# Patient Record
Sex: Female | Born: 2000 | Hispanic: No | Marital: Married | Smoking: Never smoker
Health system: Southern US, Community
[De-identification: ages and names within clinical notes are randomized; demographics above are authoritative.]

## PROBLEM LIST (undated history)

## (undated) DIAGNOSIS — D649 Anemia, unspecified: Secondary | ICD-10-CM

## (undated) HISTORY — DX: Anemia, unspecified: D64.9

## (undated) HISTORY — PX: TONSILLECTOMY: SUR1361

---
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
## 2020-01-27 LAB — RPR: RPR Ser Ql: NONREACTIVE — AB

## 2020-02-05 ENCOUNTER — Ambulatory Visit (INDEPENDENT_AMBULATORY_CARE_PROVIDER_SITE_OTHER): Payer: Medicaid Other | Admitting: Advanced Practice Midwife

## 2020-02-05 ENCOUNTER — Encounter: Payer: Self-pay | Admitting: Advanced Practice Midwife

## 2020-02-05 VITALS — BP 116/66 | HR 87

## 2020-02-05 DIAGNOSIS — N898 Other specified noninflammatory disorders of vagina: Secondary | ICD-10-CM

## 2020-02-05 DIAGNOSIS — N941 Unspecified dyspareunia: Secondary | ICD-10-CM | POA: Insufficient documentation

## 2020-02-05 NOTE — Progress Notes (Signed)
   GYN VISIT Patient name: Lindsay Bryant MRN [id number]  Date of birth: Dec 01, 2000 Chief Complaint:   IUD Check  History of Present Illness:   Lindsay Bryant is a 19 y.o. G69P1001  female being seen today for continued dyspareunia, which she notes began around April/May, possibly after strings were trimmed but she can't recall. Initial IUD placement was postpartum in Feb 2021.   May 2021: IUD check with strings trimmed; doing well but strings bother her partner June 2021: visit for dyspareunia (neg GC/chlam) July 2021: pelvic u/s>IUD centrally located, 3cm hemorrhagic R cyst, no pain during u/s  Depression screen [facility] 2/9 04/03/2019  Decreased Interest 0  Down, Depressed, Hopeless 0  PHQ - 2 Score 0  Altered sleeping 3  Tired, decreased energy 3  Change in appetite 0  Feeling bad or failure about yourself  0  Trouble concentrating 0  Moving slowly or fidgety/restless 0  Suicidal thoughts 0  PHQ-9 Score 6    No LMP recorded. (Menstrual status: IUD). The current method of family planning is IUD.  Last pap <21yo.  Review of Systems:   Pertinent items are noted in HPI Denies fever/chills, dizziness, headaches, visual disturbances, fatigue, shortness of breath, chest pain, abdominal pain, vomiting, abnormal vaginal discharge/itching/odor/irritation, problems with periods, bowel movements, urination, or intercourse unless otherwise stated above.  Pertinent History Reviewed:  Reviewed past medical,surgical, social, obstetrical and family history.  Reviewed problem list, medications and allergies. Physical Assessment:   Vitals:   02/05/20 1410  BP: 116/66  Pulse: 87  There is no height or weight on file to calculate BMI.       Physical Examination:   General appearance: alert, well appearing, and in no distress  Mental status: alert, oriented to person, place, and time  Skin: warm & dry   Cardiovascular: normal heart rate noted  Respiratory: normal respiratory effort, no  distress  Abdomen: soft, non-tender   Pelvic: normal external genitalia, vulva, vagina, cervix, uterus and adnexa; strings visualized; mod white vag d/c  Extremities: no edema   Chaperone: [place]    No results found for this or any previous visit (from the past 24 hour(s)).  Assessment & Plan:  1) Dyspareunia> offered IUD removal vs rx for Oril[place] 200mg  bid; she wants to think it over and will call with decision  2) STD screening today> will check once more  Meds: No orders of the defined types were placed in this encounter.   No orders of the defined types were placed in this encounter.   No follow-ups on file.  CNM 02/05/2020 3:47 PM

## 2020-02-08 LAB — NUSWAB VAGINITIS PLUS (VG+)
Candida albicans, NAA: NEGATIVE
Candida glabrata, NAA: NEGATIVE
Chlamydia trachomatis, NAA: NEGATIVE
Neisseria gonorrhoeae, NAA: NEGATIVE
Trich vag by NAA: NEGATIVE

## 2020-02-14 ENCOUNTER — Ambulatory Visit (INDEPENDENT_AMBULATORY_CARE_PROVIDER_SITE_OTHER): Payer: Medicaid Other | Admitting: Adult Health

## 2020-02-14 ENCOUNTER — Encounter: Payer: Self-pay | Admitting: Adult Health

## 2020-02-14 VITALS — BP 129/92 | HR 83 | Ht 67.0 in | Wt 149.0 lb

## 2020-02-14 DIAGNOSIS — Z3202 Encounter for pregnancy test, result negative: Secondary | ICD-10-CM | POA: Insufficient documentation

## 2020-02-14 DIAGNOSIS — Z30016 Encounter for initial prescription of transdermal patch hormonal contraceptive device: Secondary | ICD-10-CM | POA: Insufficient documentation

## 2020-02-14 DIAGNOSIS — N941 Unspecified dyspareunia: Secondary | ICD-10-CM

## 2020-02-14 DIAGNOSIS — Z30432 Encounter for removal of intrauterine contraceptive device: Secondary | ICD-10-CM | POA: Insufficient documentation

## 2020-02-14 LAB — POCT URINE PREGNANCY: Preg Test, Ur: NEGATIVE

## 2020-02-14 MED ORDER — NORELGESTROMIN-ETH ESTRADIOL 150-35 MCG/24HR TD PTWK: 1.0000 | MEDICATED_PATCH | TRANSDERMAL | 12 refills | Status: DC

## 2020-02-14 NOTE — Patient Instructions (Signed)
Ethinyl Estradiol; Norelgestromin skin patches What is this medicine? ETHINYL ESTRADIOL;NORELGESTROMIN (ETH in il es tra DYE ole; nor el JES troe min) skin patch is used as a contraceptive (birth control method). This medicine combines two types of female hormones, an estrogen and a progestin. This patch is used to prevent ovulation and pregnancy. This medicine may be used for other purposes; ask your health care provider or pharmacist if you have questions. COMMON BRAND NAME(S): Ortho Evra, Xulane What should I tell my health care provider before I take this medicine? They need to know if you have or ever had any of these conditions:  abnormal vaginal bleeding  blood vessel disease or blood clots  breast, cervical, endometrial, ovarian, liver, or uterine cancer  diabetes  gallbladder disease  having surgery  heart disease or recent heart attack  high blood pressure  high cholesterol or triglycerides  history of irregular heartbeat or heart valve problems  kidney disease  liver disease  migraine headaches  protein C deficiency  protein S deficiency  recently had a baby, miscarriage, or abortion  stroke  systemic lupus erythematosus (SLE)  tobacco smoker  an unusual or allergic reaction to estrogens, progestins, other medicines, foods, dyes, or preservatives  pregnant or trying to get pregnant  breast-feeding How should I use this medicine? This patch is applied to the skin. Follow the directions on the prescription label. Apply to clean, dry, healthy skin on the buttock, abdomen, upper outer arm or upper torso, in a place where it will not be rubbed by tight clothing. Do not use lotions or other cosmetics on the site where the patch will go. Press the patch firmly in place for 10 seconds to ensure good contact with the skin. Change the patch every 7 days on the same day of the week for 3 weeks. You will then have a break from the patch for 1 week, after which you  will apply a new patch. Do not use your medicine more often than directed. Contact your pediatrician regarding the use of this medicine in children. Special care may be needed. This medicine has been used in female children who have started having menstrual periods. A patient package insert for the product will be given with each prescription and refill. Read this sheet carefully each time. The sheet may change frequently. Overdosage: If you think you have taken too much of this medicine contact a poison control center or emergency room at once. NOTE: This medicine is only for you. Do not share this medicine with others. What if I miss a dose? You will need to replace your patch once a week as directed. If your patch is lost or falls off, contact your health care professional for advice. You may need to use another form of birth control if your patch has been off for more than 1 day. What may interact with this medicine? Do not take this medicine with the following medications:  dasabuvir; ombitasvir; paritaprevir; ritonavir  ombitasvir; paritaprevir; ritonavir This medicine may also interact with the following medications:  acetaminophen  antibiotics or medicines for infections, especially rifampin, rifabutin, rifapentine, and possibly penicillins or tetracyclines  aprepitant or fosaprepitant  armodafinil  ascorbic acid (vitamin C)  barbiturate medicines, such as phenobarbital or primidone  bosentan  certain antiviral medicines for hepatitis, HIV or AIDS  certain medicines for cancer treatment  certain medicines for seizures like carbamazepine, clobazam, felbamate, lamotrigine, oxcarbazepine, phenytoin, rufinamide, topiramate  certain medicines for treating high cholesterol  cyclosporine    dantrolene  elagolix  flibanserin  grapefruit juice  lesinurad  medicines for diabetes  medicines to treat fungal infections, such as griseofulvin, miconazole, fluconazole,  ketoconazole, itraconazole, posaconazole or voriconazole  mifepristone  mitotane  modafinil  morphine  mycophenolate  St. John's wort  tamoxifen  temazepam  theophylline or aminophylline  thyroid hormones  tizanidine  tranexamic acid  ulipristal  warfarin This list may not describe all possible interactions. Give your health care provider a list of all the medicines, herbs, non-prescription drugs, or dietary supplements you use. Also tell them if you smoke, drink alcohol, or use illegal drugs. Some items may interact with your medicine. What should I watch for while using this medicine? Visit your doctor or health care professional for regular checks on your progress. You will need a regular breast and pelvic exam and Pap smear while on this medicine. Use an additional method of contraception during the first cycle that you use this patch. If you have any reason to think you are pregnant, stop using this medicine right away and contact your doctor or health care professional. If you are using this medicine for hormone related problems, it may take several cycles of use to see improvement in your condition. Smoking increases the risk of getting a blood clot or having a stroke while you are using hormonal birth control, especially if you are more than 19 years old. You are strongly advised not to smoke. This medicine can make your body retain fluid, making your fingers, hands, or ankles swell. Your blood pressure can go up. Contact your doctor or health care professional if you feel you are retaining fluid. This medicine can make you more sensitive to the sun. Keep out of the sun. If you cannot avoid being in the sun, wear protective clothing and use sunscreen. Do not use sun lamps or tanning beds/booths. If you wear contact lenses and notice visual changes, or if the lenses begin to feel uncomfortable, consult your eye care specialist. In some women, tenderness, swelling, or  minor bleeding of the gums may occur. Notify your dentist if this happens. Brushing and flossing your teeth regularly may help limit this. See your dentist regularly and inform your dentist of the medicines you are taking. If you are going to have elective surgery or a MRI, you may need to stop using this medicine before the surgery or MRI. Consult your health care professional for advice. This medicine does not protect you against HIV infection (AIDS) or any other sexually transmitted diseases. What side effects may I notice from receiving this medicine? Side effects that you should report to your doctor or health care professional as soon as possible:  allergic reactions such as skin rash or itching, hives, swelling of the lips, mouth, tongue, or throat  breast tissue changes or discharge  dark patches of skin on your forehead, cheeks, upper lip, and chin  depression  high blood pressure  migraines or severe, sudden headaches  missed menstrual periods  signs and symptoms of a blood clot such as breathing problems; changes in vision; chest pain; severe, sudden headache; pain, swelling, warmth in the leg; trouble speaking; sudden numbness or weakness of the face, arm or leg  skin reactions at the patch site such as blistering, bleeding, itching, rash, or swelling  stomach pain  yellowing of the eyes or skin Side effects that usually do not require medical attention (report these to your doctor or health care professional if they continue or are bothersome):    breast tenderness  irregular vaginal bleeding or spotting, particularly during the first 3 months of use  headache  nausea  painful menstrual periods  skin redness or mild irritation at site where applied  weight gain (slight) This list may not describe all possible side effects. Call your doctor for medical advice about side effects. You may report side effects to FDA at 1-800-FDA-1088. Where should I keep my  medicine? Keep out of the reach of children. Store at room temperature between 15 and 30 degrees C (59 and 86 degrees F). Keep the patch in its pouch until time of use. Throw away any unused medicine after the expiration date. Dispose of used patches properly. Since a used patch may still contain active hormones, fold the patch in half so that it sticks to itself prior to disposal. Throw away in a place where children or pets cannot reach. NOTE: This sheet is a summary. It may not cover all possible information. If you have questions about this medicine, talk to your doctor, pharmacist, or health care provider.  2020 Elsevier/Gold Standard (2018-08-28 11:56:29)  

## 2020-02-14 NOTE — Progress Notes (Signed)
  Subjective:     Patient ID: Valleri Hendricksen, female   DOB: 07-05-2000, 19 y.o.   MRN: [id number]  HPI Dao is a 19 year old biracial female,married, G1P1 in requesting IUD to be removed, has pain with sex,and she wants to try the patch.  Review of Systems For IUD removal Has pain with sex Reviewed past medical,surgical, social and family history. Reviewed medications and allergies.     Objective:   Physical Exam BP (!) 129/92 (BP Location: Left Arm, Patient Position: Sitting, Cuff Size: Normal)   Pulse 83   Ht 5\' 7"  (1.702 m)   Wt 149 lb (67.6 kg)   LMP 01/20/2020 (Exact Date)   Breastfeeding No   BMI 23.34 kg/m   UPT is negative,  Consent signed for IUD removal.  Skin warm and dry.Pelvic: external genitalia is normal in appearance no lesions, vagina: pink and moist,urethra has no lesions or masses noted, cervix:bulbous,everted at os, IUD strings seen and grasped with forceps, pt ask to cough and IUD easily removed, uterus: normal size, shape and contour, non tender, no masses felt, adnexa: no masses or tenderness noted. Bladder is non tender and no masses felt. Examination chaperoned by 01/22/2020 LPN   [facility] - 02/14/20 1210      Pregnancy Intention Screening   Does the patient want to become pregnant in the next year? Unsure    Does the patient's partner want to become pregnant in the next year? Yes    Would the patient like to discuss contraceptive options today? Yes      Contraception Wrap Up   Current Method IUD or IUS    End Method Contraceptive Patch    Contraception Counseling Provided Yes             Assessment:     1. Urine pregnancy test negative  2. Encounter for IUD removal  3. Encounter for initial prescription of transdermal patch hormonal contraceptive device Discussed patch her her and gave handout for her review  Will Rx ortho evra patch Can place today and use condoms Meds ordered this encounter  Medications  . norelgestromin-ethinyl  estradiol (ORTHO EVRA) 150-35 MCG/24HR transdermal patch    Sig: Place 1 patch onto the skin once a week.    Dispense:  3 patch    Refill:  12    Order Specific Question:   Supervising Provider    Answer:   04/15/20, LUTHER H [[id number]]    4. Dyspareunia in female If pain continues let Despina Hidden know    Plan:     Follow up in 3 months for ROS on patch , or sooner if needed

## 2020-04-02 ENCOUNTER — Encounter: Payer: Self-pay | Admitting: *Deleted

## 2020-04-02 ENCOUNTER — Other Ambulatory Visit (INDEPENDENT_AMBULATORY_CARE_PROVIDER_SITE_OTHER): Payer: Medicaid Other | Admitting: *Deleted

## 2020-04-02 ENCOUNTER — Other Ambulatory Visit: Payer: Self-pay

## 2020-04-02 VITALS — Wt 152.0 lb

## 2020-04-02 DIAGNOSIS — R350 Frequency of micturition: Secondary | ICD-10-CM | POA: Diagnosis not present

## 2020-04-02 DIAGNOSIS — R3915 Urgency of urination: Secondary | ICD-10-CM | POA: Diagnosis not present

## 2020-04-02 DIAGNOSIS — R300 Dysuria: Secondary | ICD-10-CM

## 2020-04-02 LAB — POCT URINALYSIS DIPSTICK
Glucose, UA: NEGATIVE
Ketones, UA: NEGATIVE
Leukocytes, UA: NEGATIVE
Nitrite, UA: NEGATIVE
Protein, UA: POSITIVE — AB

## 2020-04-02 MED ORDER — SULFAMETHOXAZOLE-TRIMETHOPRIM 800-160 MG PO TABS: 1.0000 | ORAL_TABLET | Freq: Two times a day (BID) | ORAL | 0 refills | Status: DC

## 2020-04-02 NOTE — Progress Notes (Addendum)
   NURSE VISIT- UTI SYMPTOMS   SUBJECTIVE:  Lindsay Bryant is a 19 y.o. G60P1001 female here for UTI symptoms. She is a GYN patient. She reports dysuria, urinary frequency and urinary urgency.  OBJECTIVE:  Wt 152 lb (68.9 kg)   LMP 03/26/2020   BMI 23.81 kg/m   Appears well, in no apparent distress  Results for orders placed or performed in visit on 04/02/20 (from the past 24 hour(s))  POCT Urinalysis Dipstick   Collection Time: 04/02/20  2:56 PM  Result Value Ref Range   Color, UA     Clarity, UA     Glucose, UA Negative Negative   Bilirubin, UA     Ketones, UA neg    Spec Grav, UA     Blood, UA large    pH, UA     Protein, UA Positive (A) Negative   Urobilinogen, UA     Nitrite, UA neg    Leukocytes, UA Negative Negative   Appearance     Odor      ASSESSMENT: GYN patient with UTI symptoms and negative nitrites  PLAN: Discussed with Cathie Beams, CNM   Rx sent by provider today: Yes Urine culture sent Call or return to clinic prn if these symptoms worsen or fail to improve as anticipated. Follow-up: as needed   Jobe Marker  04/02/2020 2:57 PM   rx septra ds.  Chart reviewed for nurse visit. Agree with plan of care.  Jacklyn Shell, [place] 04/02/2020 7:18 PM

## 2020-04-02 NOTE — Addendum Note (Signed)
Addended by: Jacklyn Shell on: 04/02/2020 07:18 PM   Modules accepted: Orders

## 2020-04-05 LAB — URINE CULTURE

## 2020-04-06 LAB — URINE CULTURE

## 2020-05-15 ENCOUNTER — Ambulatory Visit: Payer: Medicaid Other | Admitting: Adult Health

## 2020-05-28 ENCOUNTER — Ambulatory Visit: Payer: Self-pay

## 2020-06-04 ENCOUNTER — Ambulatory Visit
Admission: EM | Admit: 2020-06-04 | Discharge: 2020-06-04 | Disposition: A | Discharge status: Home / Self Care | Payer: Medicaid Other | Attending: Family Medicine | Admitting: Family Medicine

## 2020-06-04 ENCOUNTER — Ambulatory Visit: Admit: 2020-06-04 | Discharge status: Absent without leave | Payer: Medicaid Other

## 2020-06-04 ENCOUNTER — Other Ambulatory Visit: Payer: Self-pay

## 2020-06-04 DIAGNOSIS — B349 Viral infection, unspecified: Secondary | ICD-10-CM | POA: Insufficient documentation

## 2020-06-04 DIAGNOSIS — J029 Acute pharyngitis, unspecified: Secondary | ICD-10-CM | POA: Diagnosis not present

## 2020-06-04 DIAGNOSIS — J069 Acute upper respiratory infection, unspecified: Secondary | ICD-10-CM | POA: Diagnosis not present

## 2020-06-04 DIAGNOSIS — J020 Streptococcal pharyngitis: Secondary | ICD-10-CM | POA: Diagnosis not present

## 2020-06-04 DIAGNOSIS — R5383 Other fatigue: Secondary | ICD-10-CM | POA: Diagnosis not present

## 2020-06-04 LAB — POCT RAPID STREP A (OFFICE): Rapid Strep A Screen: NEGATIVE

## 2020-06-04 MED ORDER — AZITHROMYCIN 250 MG PO TABS: 250.0000 mg | ORAL_TABLET | Freq: Every day | ORAL | 0 refills | Status: DC

## 2020-06-04 NOTE — Discharge Instructions (Addendum)
Rapid strep is negative Will culture and inform of positive results that require further treatment  I have sent in azithromycin for you to take. Take 2 tablets today, then one tablet daily for the next 4 days.  Fill this if flu and covid are negative  Your COVID and Flu tests are pending.  You should self quarantine until the test results are back.    Take Tylenol or ibuprofen as needed for fever or discomfort.  Rest and keep yourself hydrated.    Follow-up with your primary care provider if your symptoms are not improving.

## 2020-06-04 NOTE — ED Triage Notes (Signed)
Pt states that she has a sore throat and headache. x5 days. Pt states that she is not vaccinated. Negative at home rapid test x2 days ago.

## 2020-06-04 NOTE — ED Provider Notes (Signed)
[facility]   [id number] 06/04/20 Arrival Time: 1234  LO:VFIE THROAT  SUBJECTIVE: History from: patient.  Lindsay Bryant is a 19 y.o. female who presents with abrupt onset of sore throat and headache for 5 days. Denies sick exposure to Covid, strep, flu or mono, or precipitating event. Has tried tylenol without relief. Has negative history of Covid. Has not completed Covid vaccines. Reports negative home Covid test x 2 days ago. Symptoms are made worse with swallowing, but tolerating liquids and own secretions without difficulty.  Denies previous symptoms in the past.     Denies fever, chills, fatigue, ear pain, sinus pain, rhinorrhea, nasal congestion, cough, SOB, wheezing, chest pain, nausea, rash, changes in bowel or bladder habits.     ROS: As per HPI.  All other pertinent ROS negative.     Past Medical History:  Diagnosis Date  . Anemia    Past Surgical History:  Procedure Laterality Date  . TONSILLECTOMY     No Known Allergies No current facility-administered medications on file prior to encounter.   Current Outpatient Medications on File Prior to Encounter  Medication Sig Dispense Refill  . norelgestromin-ethinyl estradiol (ORTHO EVRA) 150-35 MCG/24HR transdermal patch Place 1 patch onto the skin once a week. 3 patch 12  . sulfamethoxazole-trimethoprim (BACTRIM DS) 800-160 MG tablet Take 1 tablet by mouth 2 (two) times daily. 10 tablet 0   Social History   Socioeconomic History  . Marital status: Married    Spouse name: Not on file  . Number of children: Not on file  . Years of education: Not on file  . Highest education level: High school graduate  Occupational History  . Not on file  Tobacco Use  . Smoking status: Never Smoker  . Smokeless tobacco: Never Used  Vaping Use  . Vaping Use: Never used  Substance and Sexual Activity  . Alcohol use: Never  . Drug use: Never  . Sexual activity: Yes    Birth control/protection: Patch  Other Topics  Concern  . Not on file  Social History Narrative  . Not on file   Social Determinants of Health   Financial Resource Strain: Not on file  Food Insecurity: Not on file  Transportation Needs: Not on file  Physical Activity: Not on file  Stress: Not on file  Social Connections: Not on file  Intimate Partner Violence: Not on file   Family History  Adopted: Yes    OBJECTIVE:  Vitals:   06/04/20 1332 06/04/20 1333  BP:  123/80  Pulse:  85  Temp:  98.6 F (37 C)  TempSrc:  Oral  SpO2:  96%  Weight: 144 lb (65.3 kg)   Height: 5\' 7"  (1.702 m)      General appearance: alert; appears fatigued, but nontoxic, speaking in full sentences and managing own secretions HEENT: NCAT; Ears: EACs clear, TMs pearly gray with visible cone of light, without erythema; Eyes: PERRL, EOMI grossly; Nose: no obvious rhinorrhea; Throat: oropharynx erythematous, cobblestoning present, tonsils 1+ and mildly erythematous without white tonsillar exudates, uvula midline Neck: supple with LAD Lungs: CTA bilaterally without adventitious breath sounds; cough absent Heart: regular rate and rhythm.  Radial pulses 2+ symmetrical bilaterally Skin: warm and dry Psychological: alert and cooperative; normal mood and affect  LABS: No results found for this or any previous visit (from the past 24 hour(s)).   ASSESSMENT & PLAN:  1. Upper respiratory tract infection, unspecified type   2. Sore throat   3. Viral illness  4. Other fatigue     Meds ordered this encounter  Medications  . azithromycin (ZITHROMAX) 250 MG tablet    Sig: Take 1 tablet (250 mg total) by mouth daily. Take first 2 tablets together, then 1 every day until finished.    Dispense:  6 tablet    Refill:  0    Fill 06/06/2020    Order Specific Question:   Supervising Provider    Answer:   Merrilee Jansky [[id number]]   Prescribed azithromycin for URI if viral testing is negative and symptoms have not resolved by 06/06/20 Covid test  pending Will inform of abnormal results Quarantine until test results are back and negative Strep test negative, will send out for culture and we will call you with results Declines test for mono at this time Get plenty of rest and push fluids Take OTC Zyrtec and use chloraseptic spray as needed for throat pain. Drink warm or cool liquids, use throat lozenges, or popsicles to help alleviate symptoms Take OTC ibuprofen or tylenol as needed for pain Follow up with PCP if symptoms persists Return or go to ER if patient has any new or worsening symptoms such as fever, chills, nausea, vomiting, worsening sore throat, cough, abdominal pain, chest pain, changes in bowel or bladder habits  Reviewed expectations re: course of current medical issues. Questions answered. Outlined signs and symptoms indicating need for more acute intervention. Patient verbalized understanding. After Visit Summary given.          Moshe Cipro, NP 06/07/20 1054

## 2020-06-06 LAB — SARS-COV-2, NAA 2 DAY TAT

## 2020-06-06 LAB — NOVEL CORONAVIRUS, NAA: SARS-CoV-2, NAA: NOT DETECTED

## 2020-06-07 LAB — CULTURE, GROUP A STREP (THRC)

## 2020-07-13 DIAGNOSIS — M25531 Pain in right wrist: Secondary | ICD-10-CM | POA: Diagnosis not present

## 2020-07-13 DIAGNOSIS — M25541 Pain in joints of right hand: Secondary | ICD-10-CM | POA: Diagnosis not present

## 2020-07-13 DIAGNOSIS — M79641 Pain in right hand: Secondary | ICD-10-CM | POA: Diagnosis not present

## 2020-07-26 IMAGING — MR MR HEAD WO/W CM
15 of 17 series · 44 of 48 positions shown · IV contrast (gadavist)
Comparison: None.

CLINICAL DATA: 18-year-old female post partum x4 weeks with severe
headache, worst headache of life.

EXAM:
MRI HEAD WITHOUT AND WITH CONTRAST
MRV HEAD WITHOUT CONTRAST
TECHNIQUE: Multiplanar, multiecho pulse sequences of the brain and surrounding
structures were obtained without and with intravenous contrast.
Angiographic images of the head were obtained using MRV technique
without and with contrast.
CONTRAST:  6mL GADAVIST GADOBUTROL 1 MMOL/ML IV SOLN

[Series 5: DWI · axial · 3.0mm · 0.88mm/px · z∈[-76,+46]mm · 2 of 42 slices shown (1 of 4)]
[im 1/42]
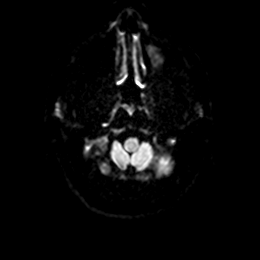
[im 42/42]
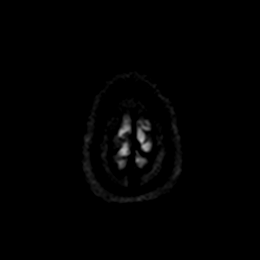

[Series 6: DWI · axial · 3.0mm · 0.88mm/px · z∈[-76,+46]mm · 2 of 42 slices shown (2 of 4)]
[im 1/42]
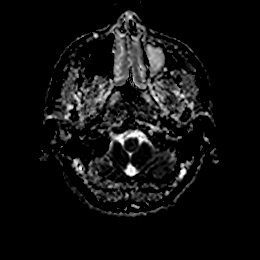
[im 42/42]
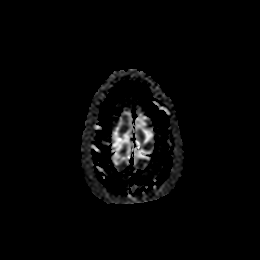

[Series 7: DWI · coronal · 4.0mm · 0.88mm/px · 1 of 32 slices shown (3 of 4)]
[im 1/32]
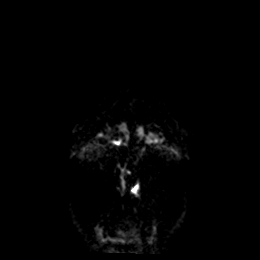

[Series 8: DWI · coronal · 4.0mm · 0.88mm/px · 1 of 32 slices shown (4 of 4)]
[im 1/32]
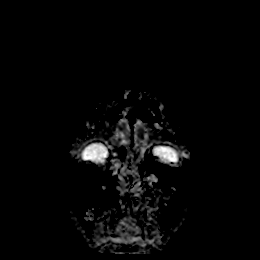

[Series 9: T1 · sagittal · 5.0mm · 0.75mm/px · 1 of 23 slices shown]
[im 1/23]
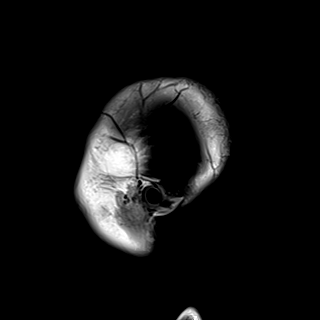

[Series 10: T2 · axial · 5.0mm · 0.72mm/px · 1 of 25 slices shown]
[im 1/25]
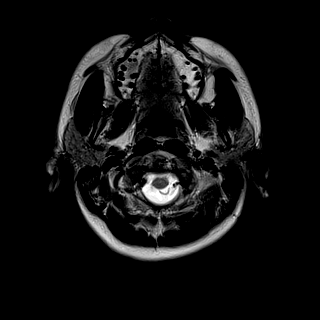

[Series 11: FLAIR · axial · 5.0mm · 0.45mm/px · 1 of 25 slices shown]
[im 1/25]
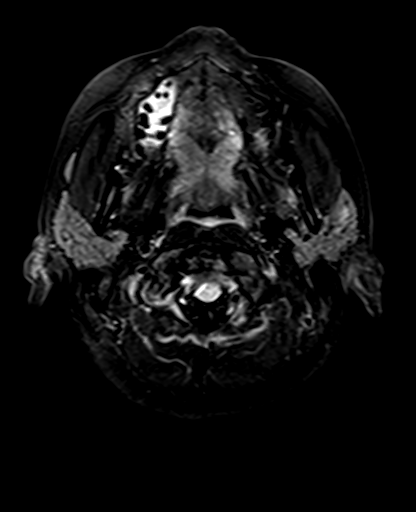

[Series 12: mag_images · axial · 3.0mm · 0.90mm/px · z∈[-84,+56]mm · 2 of 48 slices shown]
[im 1/48]
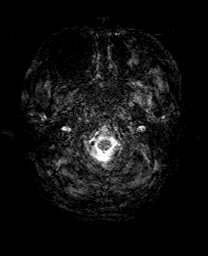
[im 48/48]
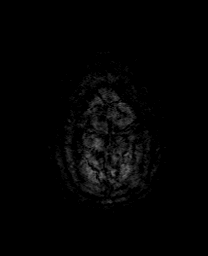

[Series 14: swi_images · axial · 3.0mm · 0.90mm/px · z∈[-84,+56]mm · 2 of 48 slices shown]
[im 1/48]
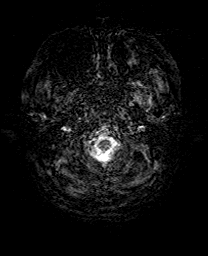
[im 48/48]
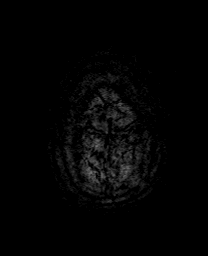

[Series 17: tof_fl2d_paracor · coronal · 2.0mm · 0.98mm/px · 5 of 128 slices shown]
[im 1/128]
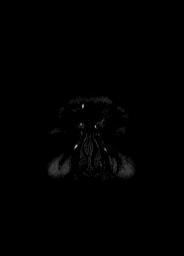
[im 32/128]
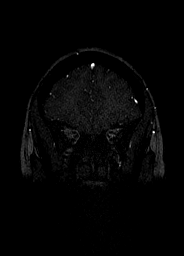
[im 64/128]
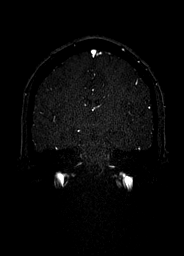
[im 96/128]
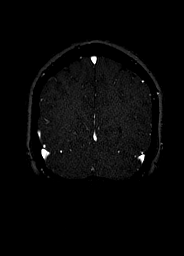
[im 128/128]
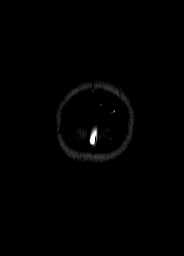

[Series 21: venous inhance coronal · coronal · portal-venous · 0.9mm · 0.57mm/px · 9 of 208 slices shown]
[im 1/208]
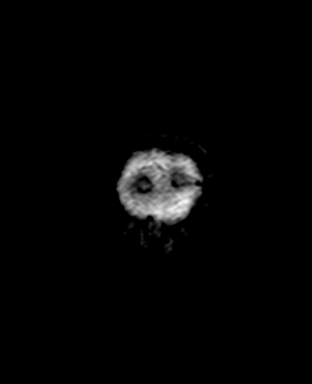
[im 26/208]
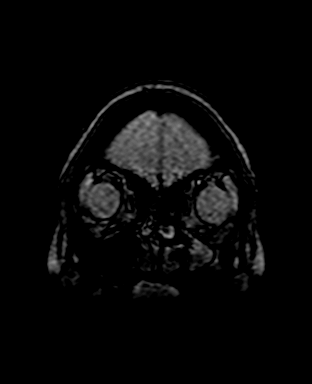
[im 52/208]
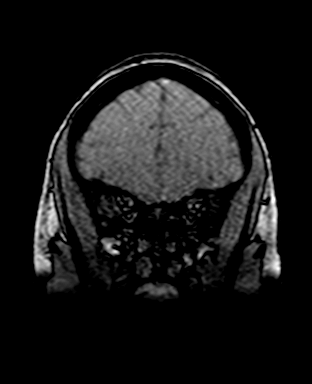
[im 78/208]
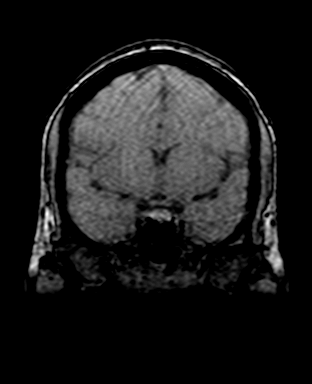
[im 104/208]
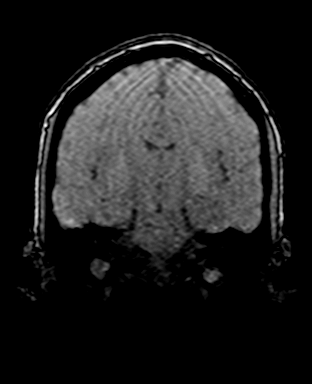
[im 130/208]
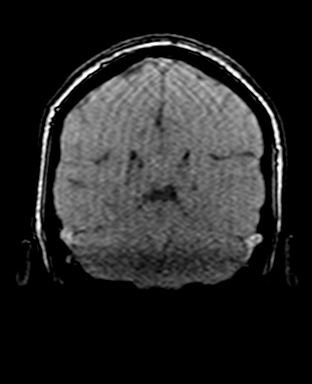
[im 156/208]
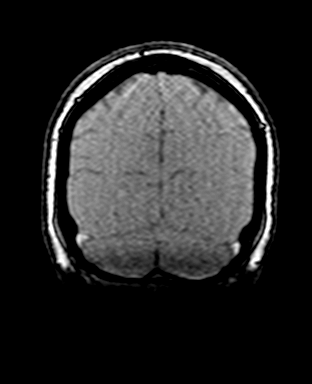
[im 182/208]
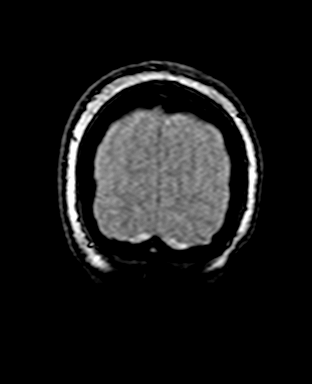
[im 208/208]
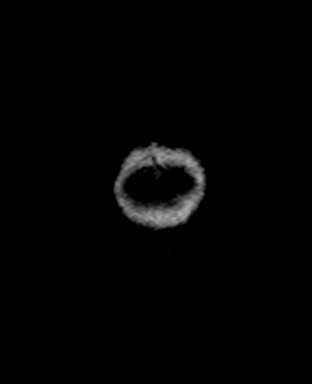

[Series 22: venous inhance coronal_msum · coronal · portal-venous · 0.9mm · 0.57mm/px · 8 of 207 slices shown]
[im 1/207]
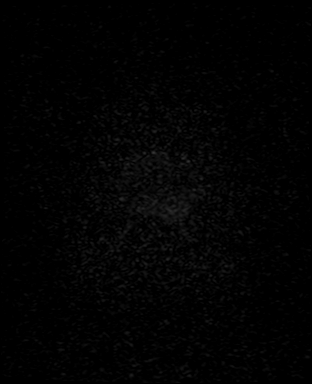
[im 30/207]
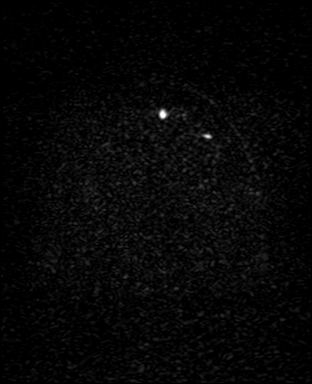
[im 59/207]
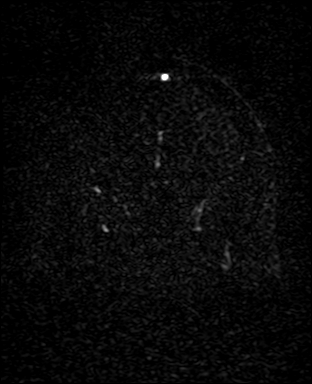
[im 89/207]
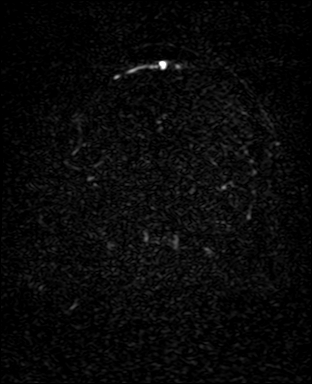
[im 118/207]
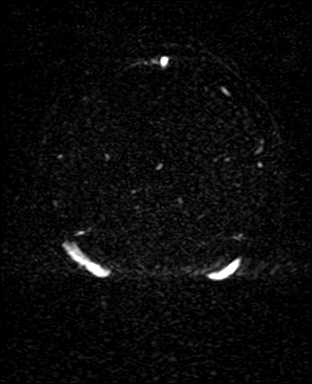
[im 148/207]
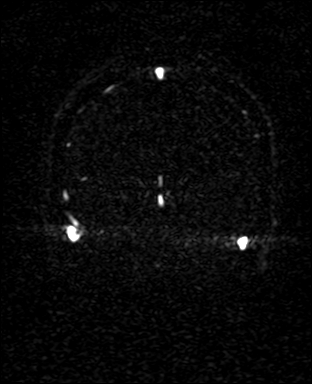
[im 177/207]
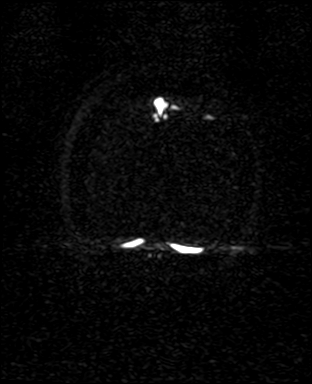
[im 207/207]
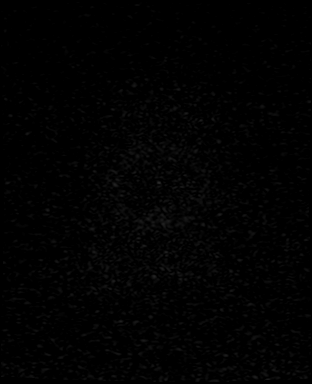

[Series 25: t1_fl3d_sag_p2_iso · sagittal · 1.0mm · 0.98mm/px · 7 of 160 slices shown]
[im 1/160]
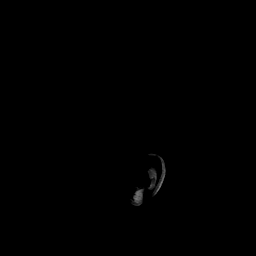
[im 27/160]
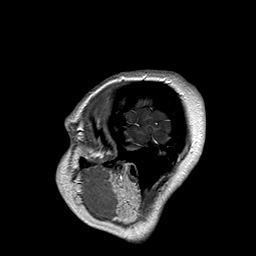
[im 54/160]
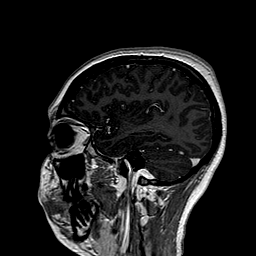
[im 80/160]
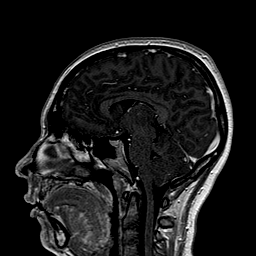
[im 107/160]
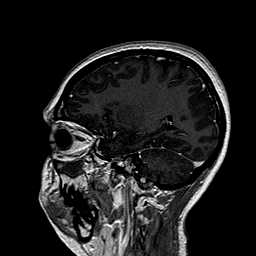
[im 133/160]
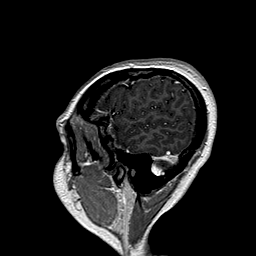
[im 160/160]
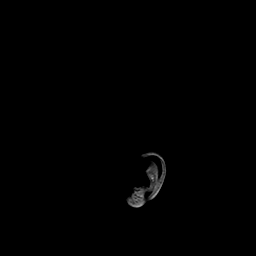

[Series 26: T2 post-contrast · coronal · 5.0mm · 0.72mm/px · 1 of 28 slices shown]
[im 1/28]
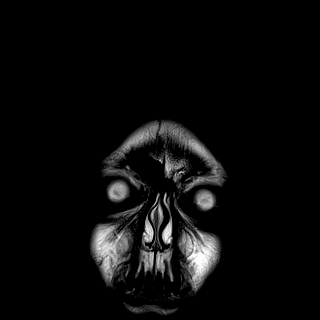

[Series 28: T1 post-contrast · coronal · 5.0mm · 0.34mm/px · 1 of 28 slices shown]
[im 1/28]
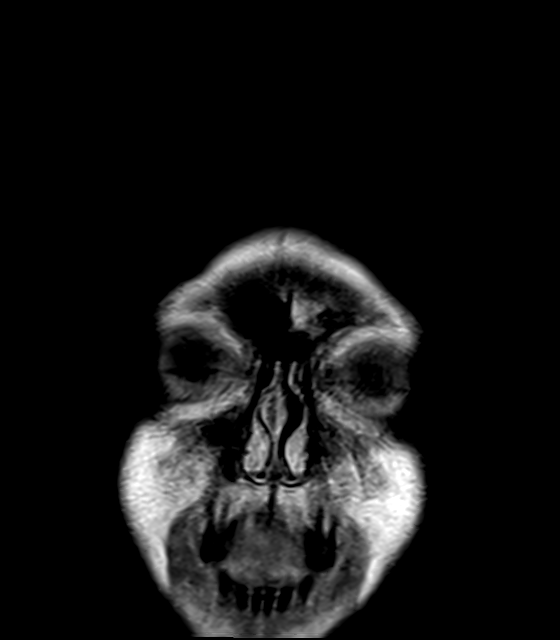

[44 of 48 positions shown; findings below may reference images not displayed]

FINDINGS: MRI HEAD FINDINGS

Brain: Normal cerebral volume. No restricted diffusion to suggest
acute infarction. No midline shift, mass effect, evidence of mass
lesion, ventriculomegaly, extra-axial collection or acute
intracranial hemorrhage. Cervicomedullary junction and pituitary are
within normal limits.

Gray and white matter signal appears within normal limits throughout
the brain. No encephalomalacia or chronic cerebral blood products
identified.

No abnormal enhancement identified.  No dural thickening.

Vascular: Major intracranial vascular flow voids are preserved. The
right vertebral artery appears dominant.

Skull and upper cervical spine: Negative visible cervical spine and
spinal cord. Visualized bone marrow signal is within normal limits.

Sinuses/Orbits: Negative orbits. Left maxillary sinus mucosal
thickening and inspissated secretions. Trace left frontoethmoidal
recess mucosal thickening. Other paranasal sinuses are clear.

Other: Mastoids are clear. Visible internal auditory structures
appear normal. Scalp and face soft tissues appear negative.

MRV HEAD FINDINGS

Precontrast time-of-flight MRV images demonstrate preserved flow
signal in the superior sagittal sinus, torcula, straight sinus, vein
of Bo Stig, internal cerebral veins. Preserved flow signal in both
transverse and sigmoid sinuses. Preserved flow in both IJ bulbs.
Preserved flow signal also in the major bilateral cortical draining
veins, vein of Trolard and vein of Labbe

Postcontrast MRV images demonstrate normal enhancement of the major
dural venous sinuses.
IMPRESSION: 1.  Normal MRI appearance of the brain.
2. Normal intracranial MRV, negative for dural venous sinus
thrombosis.
3. Left maxillary sinusitis may be acute or acute on chronic.

## 2020-11-23 ENCOUNTER — Other Ambulatory Visit: Payer: Self-pay

## 2020-11-23 ENCOUNTER — Ambulatory Visit (INDEPENDENT_AMBULATORY_CARE_PROVIDER_SITE_OTHER): Payer: Medicaid Other

## 2020-11-23 VITALS — BP 115/71 | HR 80 | Ht 67.0 in | Wt 136.0 lb

## 2020-11-23 DIAGNOSIS — Z3201 Encounter for pregnancy test, result positive: Secondary | ICD-10-CM

## 2020-11-23 DIAGNOSIS — Z3200 Encounter for pregnancy test, result unknown: Secondary | ICD-10-CM

## 2020-11-23 LAB — POCT URINE PREGNANCY: Preg Test, Ur: POSITIVE — AB

## 2020-11-23 MED ORDER — PROMETHAZINE HCL 25 MG PO TABS: 25.0000 mg | ORAL_TABLET | Freq: Four times a day (QID) | ORAL | 1 refills | Status: DC | PRN

## 2020-11-23 NOTE — Addendum Note (Signed)
Addended by: Cyril Mourning A on: 11/23/2020 11:35 AM   Modules accepted: Orders

## 2020-11-23 NOTE — Progress Notes (Addendum)
Chart reviewed for nurse visit. Agree with plan of care. Will rx phenergan  Adline Potter, NP 11/23/2020 11:34 AM

## 2020-11-23 NOTE — Progress Notes (Signed)
   NURSE VISIT- PREGNANCY CONFIRMATION   SUBJECTIVE:  Lindsay Bryant is a 20 y.o. G48P1001 female at [redacted]w[redacted]d by certain LMP of Patient's last menstrual period was 10/02/2020 (exact date). Here for pregnancy confirmation.  Home pregnancy test: positive x 3   She reports nausea.  She is taking prenatal vitamins.    OBJECTIVE:  BP 115/71 (BP Location: Right Arm, Patient Position: Sitting, Cuff Size: Normal)   Pulse 80   Ht 5\' 7"  (1.702 m)   Wt 136 lb (61.7 kg)   LMP 10/02/2020 (Exact Date)   Breastfeeding No   BMI 21.30 kg/m   Appears well, in no apparent distress  Results for orders placed or performed in visit on 11/23/20 (from the past 24 hour(s))  POCT urine pregnancy   Collection Time: 11/23/20 11:22 AM  Result Value Ref Range   Preg Test, Ur Positive (A) Negative    ASSESSMENT: Positive pregnancy test, [redacted]w[redacted]d by LMP    PLAN: Schedule for dating ultrasound in 1-2 weeks Prenatal vitamins: continue   Nausea medicines: requested-note routed to [redacted]w[redacted]d to send prescription   OB packet given: Yes  Tiane Szydlowski A Shyna Duignan  11/23/2020 11:25 AM

## 2020-12-08 ENCOUNTER — Other Ambulatory Visit: Payer: Self-pay | Admitting: Obstetrics & Gynecology

## 2020-12-08 DIAGNOSIS — O3680X0 Pregnancy with inconclusive fetal viability, not applicable or unspecified: Secondary | ICD-10-CM

## 2020-12-09 ENCOUNTER — Ambulatory Visit (INDEPENDENT_AMBULATORY_CARE_PROVIDER_SITE_OTHER): Payer: Medicaid Other

## 2020-12-09 ENCOUNTER — Other Ambulatory Visit: Payer: Self-pay

## 2020-12-09 DIAGNOSIS — Z3A09 9 weeks gestation of pregnancy: Secondary | ICD-10-CM

## 2020-12-09 DIAGNOSIS — O3680X0 Pregnancy with inconclusive fetal viability, not applicable or unspecified: Secondary | ICD-10-CM

## 2020-12-09 NOTE — Progress Notes (Signed)
[place] 9+5 wks,single IUP with YS,positive FHT 176 bpm,normal ovaries,CRL 28.9 mm

## 2020-12-23 DIAGNOSIS — Z3490 Encounter for supervision of normal pregnancy, unspecified, unspecified trimester: Secondary | ICD-10-CM | POA: Insufficient documentation

## 2020-12-25 ENCOUNTER — Other Ambulatory Visit: Payer: Self-pay | Admitting: Obstetrics & Gynecology

## 2020-12-25 DIAGNOSIS — Z3682 Encounter for antenatal screening for nuchal translucency: Secondary | ICD-10-CM

## 2020-12-28 ENCOUNTER — Other Ambulatory Visit: Payer: Self-pay

## 2020-12-28 ENCOUNTER — Ambulatory Visit: Payer: Medicaid Other | Admitting: *Deleted

## 2020-12-28 ENCOUNTER — Other Ambulatory Visit: Payer: Self-pay | Admitting: Advanced Practice Midwife

## 2020-12-28 ENCOUNTER — Ambulatory Visit (INDEPENDENT_AMBULATORY_CARE_PROVIDER_SITE_OTHER): Payer: Medicaid Other | Admitting: Advanced Practice Midwife

## 2020-12-28 ENCOUNTER — Encounter: Payer: Self-pay | Admitting: Advanced Practice Midwife

## 2020-12-28 ENCOUNTER — Ambulatory Visit (INDEPENDENT_AMBULATORY_CARE_PROVIDER_SITE_OTHER): Payer: Medicaid Other

## 2020-12-28 VITALS — BP 111/71 | HR 83 | Wt 140.0 lb

## 2020-12-28 DIAGNOSIS — Z3A12 12 weeks gestation of pregnancy: Secondary | ICD-10-CM | POA: Diagnosis not present

## 2020-12-28 DIAGNOSIS — O26899 Other specified pregnancy related conditions, unspecified trimester: Secondary | ICD-10-CM | POA: Insufficient documentation

## 2020-12-28 DIAGNOSIS — Z3480 Encounter for supervision of other normal pregnancy, unspecified trimester: Secondary | ICD-10-CM | POA: Diagnosis not present

## 2020-12-28 DIAGNOSIS — Z6791 Unspecified blood type, Rh negative: Secondary | ICD-10-CM

## 2020-12-28 DIAGNOSIS — Z3682 Encounter for antenatal screening for nuchal translucency: Secondary | ICD-10-CM

## 2020-12-28 DIAGNOSIS — Z3481 Encounter for supervision of other normal pregnancy, first trimester: Secondary | ICD-10-CM | POA: Diagnosis not present

## 2020-12-28 LAB — POCT URINALYSIS DIPSTICK OB
Blood, UA: NEGATIVE
Glucose, UA: NEGATIVE
Ketones, UA: NEGATIVE
Leukocytes, UA: NEGATIVE
Nitrite, UA: NEGATIVE
POC,PROTEIN,UA: NEGATIVE

## 2020-12-28 NOTE — Progress Notes (Signed)
[place] 12+3 wks,measurements c/w dates,CRL 61.83 mm,FHR 158 bpm,normal ovaries,anterior placenta gr 0,NB present,NT 1.1 mm

## 2020-12-28 NOTE — Progress Notes (Signed)
Subjective:   Lindsay Bryant is a 20 y.o. G2P1001 at [redacted]w[redacted]d by  LMP, early ultrasound being seen today for her first obstetrical visit.    Gynecological/Obstetrical History: Her obstetrical history is significant for  none . Patient does intend to breast feed. Pregnancy history fully reviewed. Patient reports no complaints. Sexual Activity and Vaginal Concerns  Medical History/ROS:   Social History: Patient denies current usage of tobacco, alcohol, or drugs.  Patient reports the FOB is Lindsay Bryant who is involved/married.  Patient reports that she lives with husband and older child and endorses safety at home.  Patient denies DV/A. Patient is not currently employed.  HISTORY: OB History  Gravida Para Term Preterm AB Living  2 1 1  0 0 1  SAB IAB Ectopic Multiple Live Births  0 0 0 0 1    # Outcome Date GA Lbr Len/2nd Weight Sex Delivery Anes PTL Lv  2 Current           1 Term 06/14/19 [redacted]w[redacted]d 08:22 / 04:16 9 lb 10.7 oz (4.386 kg) M Vag-Spont EPI N LIV     Name: Lindsay Bryant     Apgar1: 8  Apgar5: 9    Last pap smear was done NA, AGE  Past Medical History:  Diagnosis Date   Anemia    Past Surgical History:  Procedure Laterality Date   TONSILLECTOMY     Family History  Adopted: Yes   Social History   Tobacco Use   Smoking status: Never   Smokeless tobacco: Never  Vaping Use   Vaping Use: Never used  Substance Use Topics   Alcohol use: Never   Drug use: Never   No Known Allergies Current Outpatient Medications on File Prior to Visit  Medication Sig Dispense Refill   Prenatal Vit-Fe Fumarate-FA (PRENATAL VITAMIN PO) Take by mouth.     promethazine (PHENERGAN) 25 MG tablet Take 1 tablet (25 mg total) by mouth every 6 (six) hours as needed for nausea or vomiting. (Patient not taking: Reported on 12/28/2020) 30 tablet 1   No current facility-administered medications on file prior to visit.    Review of Systems Pertinent items noted in HPI and remainder of  comprehensive ROS otherwise negative.  Exam   Vitals:   12/28/20 1541  BP: 111/71  Pulse: 83  Weight: 140 lb (63.5 kg)   Fetal Heart Rate (bpm): 140  Physical Exam Constitutional:      General: She is not in acute distress. HENT:     Head: Normocephalic.  Eyes:     Pupils: Pupils are equal, round, and reactive to light.  Cardiovascular:     Rate and Rhythm: Normal rate and regular rhythm.  Pulmonary:     Effort: Pulmonary effort is normal.  Abdominal:     Palpations: Abdomen is soft.     Tenderness: There is no abdominal tenderness.  Neurological:     Mental Status: She is alert.  Skin:    General: Skin is warm and dry.  Psychiatric:        Mood and Affect: Mood normal.  Vitals and nursing note reviewed.    Assessment:   20 y.o. year old G2P1001 Patient Active Problem List   Diagnosis Date Noted   Rh negative state in antepartum period 12/28/2020   Encounter for supervision of normal pregnancy, antepartum 12/23/2020   Dyspareunia in female 02/05/2020     Plan:  1. Supervision of other normal pregnancy, antepartum - Routine care  2.  Rh negative state in antepartum period   3. [redacted] weeks gestation of pregnancy      Problem list reviewed and updated. Routine obstetric precautions reviewed.  Orders Placed This Encounter  Procedures   Urine Culture   GC/Chlamydia Probe Amp   Integrated 1    Order Specific Question:   Is patient insulin dependent?    Answer:   No    Order Specific Question:   Patient race?    Answer:   Other    Order Specific Question:   Weight (lbs)    Answer:   140    Order Specific Question:   Number of fetuses    Answer:   1    Order Specific Question:   Additional information?    Answer:   nasal bone present    Order Specific Question:   Crown rump length (mm)?    Answer:   61.83    Order Specific Question:   Crown rump length date?    Answer:   12/28/2020    Order Specific Question:   Nuchal translucency (mm)    Answer:    1.1    Order Specific Question:   Donor egg?    Answer:   N    Order Specific Question:   Sonographer last name    Answer:   Baldo Ash    Order Specific Question:   Sonographer first name    Answer:   Hydrologist Specific Question:   Sonographer ID number    Answer:   [id number]    Order Specific Question:   Credentialed by    Answer:   NTQR   Pain Management Screening Profile (10S)   Genetic Screening    Panorama/Horizon   CBC/D/Plt+RPR+Rh+ABO+RubIgG...   POC Urinalysis Dipstick OB    Return in about 4 weeks (around 01/25/2021).     Thressa Sheller DNP, CNM  12/28/20  4:26 PM

## 2020-12-30 LAB — CBC/D/PLT+RPR+RH+ABO+RUBIGG...
Antibody Screen: NEGATIVE
Basophils Absolute: 0 10*3/uL (ref 0.0–0.2)
Basos: 0 %
EOS (ABSOLUTE): 0.1 10*3/uL (ref 0.0–0.4)
Eos: 1 %
HCV Ab: <0.1 s/co ratio (ref 0.0–0.9)
HIV Screen 4th Generation wRfx: NONREACTIVE
Hematocrit: 39.2 % (ref 34.0–46.6)
Hemoglobin: 13.2 g/dL (ref 11.1–15.9)
Hepatitis B Surface Ag: NEGATIVE
Immature Grans (Abs): 0 10*3/uL (ref 0.0–0.1)
Immature Granulocytes: 0 %
Lymphocytes Absolute: 2.6 10*3/uL (ref 0.7–3.1)
Lymphs: 26 %
MCH: 29.7 pg (ref 26.6–33.0)
MCHC: 33.7 g/dL (ref 31.5–35.7)
MCV: 88 fL (ref 79–97)
Monocytes Absolute: 0.6 10*3/uL (ref 0.1–0.9)
Monocytes: 6 %
Neutrophils Absolute: 6.7 10*3/uL (ref 1.4–7.0)
Neutrophils: 67 %
Platelets: 238 10*3/uL (ref 150–450)
RBC: 4.44 x10E6/uL (ref 3.77–5.28)
RDW: 13.9 % (ref 11.7–15.4)
RPR Ser Ql: REACTIVE — AB
Rh Factor: NEGATIVE
Rubella Antibodies, IGG: 1.37 index (ref >0.99)
WBC: 10.1 10*3/uL (ref 3.4–10.8)

## 2020-12-30 LAB — INTEGRATED 1
Crown Rump Length: 61.8 mm
Gest. Age on Collection Date: 12.4 weeks
Maternal Age at EDD: 20.8 yr
Nuchal Translucency (NT): 1.1 mm
Number of Fetuses: 1
PAPP-A Value: 2593.4 ng/mL
Weight: 140 [lb_av]

## 2020-12-30 LAB — URINE CULTURE

## 2020-12-30 LAB — HCV INTERPRETATION

## 2020-12-30 LAB — RPR, QUANT+TP ABS (REFLEX)
Rapid Plasma Reagin, Quant: 1:4 {titer} — ABNORMAL HIGH
T Pallidum Abs: NONREACTIVE

## 2020-12-30 LAB — SPECIMEN STATUS REPORT

## 2021-01-03 LAB — PMP SCREEN PROFILE (10S), URINE
Amphetamine Scrn, Ur: NEGATIVE ng/mL
BARBITURATE SCREEN URINE: NEGATIVE ng/mL
BENZODIAZEPINE SCREEN, URINE: NEGATIVE ng/mL
CANNABINOIDS UR QL SCN: NEGATIVE ng/mL
Cocaine (Metab) Scrn, Ur: NEGATIVE ng/mL
Creatinine(Crt), U: 95.8 mg/dL (ref 20.0–300.0)
Methadone Screen, Urine: NEGATIVE ng/mL
OXYCODONE+OXYMORPHONE UR QL SCN: NEGATIVE ng/mL
Opiate Scrn, Ur: NEGATIVE ng/mL
Ph of Urine: 8.5 (ref 4.5–8.9)
Phencyclidine Qn, Ur: NEGATIVE ng/mL
Propoxyphene Scrn, Ur: NEGATIVE ng/mL

## 2021-01-03 LAB — GC/CHLAMYDIA PROBE AMP
Chlamydia trachomatis, NAA: POSITIVE — AB
Neisseria Gonorrhoeae by PCR: NEGATIVE

## 2021-01-03 LAB — SPECIMEN STATUS REPORT

## 2021-01-11 ENCOUNTER — Other Ambulatory Visit: Payer: Self-pay | Admitting: Women's Health

## 2021-01-11 ENCOUNTER — Telehealth: Payer: Self-pay | Admitting: Women's Health

## 2021-01-11 DIAGNOSIS — A749 Chlamydial infection, unspecified: Secondary | ICD-10-CM | POA: Insufficient documentation

## 2021-01-11 DIAGNOSIS — Z3480 Encounter for supervision of other normal pregnancy, unspecified trimester: Secondary | ICD-10-CM

## 2021-01-11 MED ORDER — AZITHROMYCIN 500 MG PO TABS: 1000.0000 mg | ORAL_TABLET | Freq: Once | ORAL | 0 refills | Status: AC

## 2021-01-11 NOTE — Telephone Encounter (Signed)
Called pt to inform her of test results and prescription sent for treatment. Explained that Joellyn Haff would also treat her partner. Pt will send his info via Mychart msg. Pt confirmed understanding of treatment and POC at future visit.

## 2021-01-11 NOTE — Telephone Encounter (Signed)
[name] w/[facility] - needs proof of treatment of pt's chlamydia  Please advise  Ph# [phone], Fx# [phone]

## 2021-01-11 NOTE — Progress Notes (Signed)
Azithromycin 1gm po x 1 called in for partner Blythe Veach, DOB: 09/04/1998 No known allergies to [facility] Cheri Rous, CNM, [facility] 01/11/2021 4:28 PM

## 2021-01-12 ENCOUNTER — Telehealth: Payer: Self-pay

## 2021-01-12 NOTE — Telephone Encounter (Signed)
Called pt to relay [employer] msg regarding prescription change for pt's partner. He had not yet picked up the medication, but she will inform him.

## 2021-01-18 ENCOUNTER — Encounter: Payer: Self-pay | Admitting: Advanced Practice Midwife

## 2021-01-25 ENCOUNTER — Other Ambulatory Visit: Payer: Self-pay

## 2021-01-25 ENCOUNTER — Ambulatory Visit (INDEPENDENT_AMBULATORY_CARE_PROVIDER_SITE_OTHER): Payer: Medicaid Other | Admitting: Obstetrics & Gynecology

## 2021-01-25 ENCOUNTER — Encounter: Payer: Self-pay | Admitting: Obstetrics & Gynecology

## 2021-01-25 VITALS — BP 94/63 | HR 92 | Wt 142.0 lb

## 2021-01-25 DIAGNOSIS — Z3480 Encounter for supervision of other normal pregnancy, unspecified trimester: Secondary | ICD-10-CM

## 2021-01-25 DIAGNOSIS — Z1379 Encounter for other screening for genetic and chromosomal anomalies: Secondary | ICD-10-CM | POA: Diagnosis not present

## 2021-01-25 DIAGNOSIS — Z3A16 16 weeks gestation of pregnancy: Secondary | ICD-10-CM

## 2021-01-25 NOTE — Progress Notes (Signed)
   LOW-RISK PREGNANCY VISIT Patient name: Lindsay Bryant MRN [id number]  Date of birth: 08-11-2000 Chief Complaint:   Routine Prenatal Visit (2nd IT)  History of Present Illness:   Lindsay Bryant is a 20 y.o. G45P1001 female at [redacted]w[redacted]d with an Estimated Date of Delivery: 07/09/21 being seen today for ongoing management of a low-risk pregnancy.  Depression screen [facility] 2/9 12/28/2020 04/03/2019  Decreased Interest 3 0  Down, Depressed, Hopeless 3 0  PHQ - 2 Score 6 0  Altered sleeping 2 3  Tired, decreased energy 3 3  Change in appetite 2 0  Feeling bad or failure about yourself  3 0  Trouble concentrating 0 0  Moving slowly or fidgety/restless 0 0  Suicidal thoughts 1 0  PHQ-9 Score 17 6    Today she reports no complaints. Contractions: Not present.  .   . denies leaking of fluid. Review of Systems:   Pertinent items are noted in HPI Denies abnormal vaginal discharge w/ itching/odor/irritation, headaches, visual changes, shortness of breath, chest pain, abdominal pain, severe nausea/vomiting, or problems with urination or bowel movements unless otherwise stated above. Pertinent History Reviewed:  Reviewed past medical,surgical, social, obstetrical and family history.  Reviewed problem list, medications and allergies. Physical Assessment:   Vitals:   01/25/21 1122  BP: 94/63  Pulse: 92  Weight: 142 lb (64.4 kg)  Body mass index is 22.24 kg/m.        Physical Examination:   General appearance: Well appearing, and in no distress  Mental status: Alert, oriented to person, place, and time  Skin: Warm & dry  Cardiovascular: Normal heart rate noted  Respiratory: Normal respiratory effort, no distress  Abdomen: Soft, gravid, nontender  Pelvic: Cervical exam deferred         Extremities: Edema: None  Fetal Status:          Chaperone: n/a    No results found for this or any previous visit (from the past 24 hour(s)).  Assessment & Plan:  1) Low-risk pregnancy G2P1001 at [redacted]w[redacted]d with an  Estimated Date of Delivery: 07/09/21   2) ,    Meds: No orders of the defined types were placed in this encounter.  Labs/procedures today: IT2  Plan:  Continue routine obstetrical care  Next visit: prefers in person    Reviewed: Preterm labor symptoms and general obstetric precautions including but not limited to vaginal bleeding, contractions, leaking of fluid and fetal movement were reviewed in detail with the patient.  All questions were answered. Has home bp cuff. Rx faxed to . Check bp weekly, let us know if >140/90.   Follow-up: Return in about 3 weeks (around 02/15/2021) for 20 week sono, LROB.  Orders Placed This Encounter  Procedures   US OB Comp + 14 Wk   INTEGRATED 2    Lazaro Arms, MD 01/25/2021 11:52 AM

## 2021-01-27 LAB — INTEGRATED 2
AFP MoM: 1
Alpha-Fetoprotein: 36.1 ng/mL
Crown Rump Length: 61.8 mm
DIA MoM: 0.57
DIA Value: 91.8 pg/mL
Estriol, Unconjugated: 1.23 ng/mL
Gest. Age on Collection Date: 12.4 weeks
Gestational Age: 16.4 weeks
Maternal Age at EDD: 20.8 yr
Nuchal Translucency (NT): 1.1 mm
Nuchal Translucency MoM: 0.8
Number of Fetuses: 1
PAPP-A MoM: 2.48
PAPP-A Value: 2593.4 ng/mL
Test Results:: NEGATIVE
Weight: 140 [lb_av]
Weight: 142 [lb_av]
hCG MoM: 0.47
hCG Value: 17 IU/mL
uE3 MoM: 1.16

## 2021-02-15 ENCOUNTER — Other Ambulatory Visit: Payer: Self-pay | Admitting: Obstetrics & Gynecology

## 2021-02-15 DIAGNOSIS — Z3480 Encounter for supervision of other normal pregnancy, unspecified trimester: Secondary | ICD-10-CM

## 2021-02-15 DIAGNOSIS — Z363 Encounter for antenatal screening for malformations: Secondary | ICD-10-CM

## 2021-02-16 ENCOUNTER — Ambulatory Visit (INDEPENDENT_AMBULATORY_CARE_PROVIDER_SITE_OTHER): Payer: Medicaid Other | Admitting: Women's Health

## 2021-02-16 ENCOUNTER — Encounter: Payer: Self-pay | Admitting: Women's Health

## 2021-02-16 ENCOUNTER — Other Ambulatory Visit (HOSPITAL_COMMUNITY)
Admission: RE | Admit: 2021-02-16 | Discharge: 2021-02-16 | Disposition: A | Discharge status: Home / Self Care | Payer: Medicaid Other | Source: Ambulatory Visit | Attending: Women's Health | Admitting: Women's Health

## 2021-02-16 ENCOUNTER — Other Ambulatory Visit: Payer: Medicaid Other

## 2021-02-16 ENCOUNTER — Other Ambulatory Visit: Payer: Self-pay

## 2021-02-16 ENCOUNTER — Ambulatory Visit (INDEPENDENT_AMBULATORY_CARE_PROVIDER_SITE_OTHER): Payer: Medicaid Other

## 2021-02-16 VITALS — BP 107/64 | HR 78 | Wt 147.0 lb

## 2021-02-16 DIAGNOSIS — A749 Chlamydial infection, unspecified: Secondary | ICD-10-CM

## 2021-02-16 DIAGNOSIS — O26892 Other specified pregnancy related conditions, second trimester: Secondary | ICD-10-CM | POA: Insufficient documentation

## 2021-02-16 DIAGNOSIS — A530 Latent syphilis, unspecified as early or late: Secondary | ICD-10-CM

## 2021-02-16 DIAGNOSIS — N898 Other specified noninflammatory disorders of vagina: Secondary | ICD-10-CM | POA: Diagnosis not present

## 2021-02-16 DIAGNOSIS — Z363 Encounter for antenatal screening for malformations: Secondary | ICD-10-CM

## 2021-02-16 DIAGNOSIS — Z3A19 19 weeks gestation of pregnancy: Secondary | ICD-10-CM | POA: Diagnosis not present

## 2021-02-16 DIAGNOSIS — Z3482 Encounter for supervision of other normal pregnancy, second trimester: Secondary | ICD-10-CM | POA: Diagnosis not present

## 2021-02-16 DIAGNOSIS — Z3480 Encounter for supervision of other normal pregnancy, unspecified trimester: Secondary | ICD-10-CM

## 2021-02-16 DIAGNOSIS — Z23 Encounter for immunization: Secondary | ICD-10-CM | POA: Diagnosis not present

## 2021-02-16 DIAGNOSIS — Z6791 Unspecified blood type, Rh negative: Secondary | ICD-10-CM

## 2021-02-16 DIAGNOSIS — O26899 Other specified pregnancy related conditions, unspecified trimester: Secondary | ICD-10-CM

## 2021-02-16 NOTE — Progress Notes (Signed)
    LOW-RISK PREGNANCY VISIT Patient name: Lindsay Bryant MRN [id number]  Date of birth: 2000-11-20 Chief Complaint:   Routine Prenatal Visit (Anatomy scan)  History of Present Illness:   Lindsay Bryant is a 20 y.o. G31P1001 female at [redacted]w[redacted]d with an Estimated Date of Delivery: 07/09/21 being seen today for ongoing management of a low-risk pregnancy.   Today she reports  heavy bleeding after sex ~1.5-2wks ago (large spot on bed, then 'everywhere else', lightened and went away . Sex was a little rougher that time. Few days later had large amt clear discharge that went through thick cotton underwear. None since. Denies abnormal discharge, itching/odor/irritation.  Had +CT, was tx 8/8, states partner took his meds too, waited at least 7d to have sex. Discussed +RPR, pt states no h/o or tx in past.  Contractions: Not present. Vag. Bleeding: None.  Movement: Present. denies leaking of fluid.    Review of Systems:   Pertinent items are noted in HPI Denies abnormal vaginal discharge w/ itching/odor/irritation, headaches, visual changes, shortness of breath, chest pain, abdominal pain, severe nausea/vomiting, or problems with urination or bowel movements unless otherwise stated above. Pertinent History Reviewed:  Reviewed past medical,surgical, social, obstetrical and family history.  Reviewed problem list, medications and allergies. Physical Assessment:   Vitals:   02/16/21 1128  BP: 107/64  Pulse: 78  Weight: 147 lb (66.7 kg)  Body mass index is 23.02 kg/m.        Physical Examination:   General appearance: Well appearing, and in no distress  Mental status: Alert, oriented to person, place, and time  Skin: Warm & dry  Cardiovascular: Normal heart rate noted  Respiratory: Normal respiratory effort, no distress  Abdomen: Soft, gravid, nontender  Pelvic:  spec exam: cx visually closed, normal appearing d/c, no blood          Extremities: Edema: None  Fetal Status: Fetal Heart Rate (bpm): 142 u/s    Movement: Present  [place] 19+4 wks,cephalic,anterior placenta gr 0,normal ovaries,fhr 142 bpm,LVEICF 2.5 mm,cx 4.7 cm,EFW 350 g 86%,anatomy complete  Chaperone: Latisha Cresenzo   No results found for this or any previous visit (from the past 24 hour(s)).  Assessment & Plan:  1) Low-risk pregnancy G2P1001 at [redacted]w[redacted]d with an Estimated Date of Delivery: 07/09/21   2) Postcoital bleeding, none since, CV swab sent  3) Recent +CT> POC today  4) RPR reactive> 1:4 w/ neg Tpall Abs on 7/25. Pt unaware until today. No h/o or tx for syphilis. Will redraw today   Meds: No orders of the defined types were placed in this encounter.  Labs/procedures today: CV swab and U/S  Plan:  Continue routine obstetrical care  Next visit: prefers in person    Reviewed: Preterm labor symptoms and general obstetric precautions including but not limited to vaginal bleeding, contractions, leaking of fluid and fetal movement were reviewed in detail with the patient.  All questions were answered. Does have home bp cuff. Office bp cuff given: not applicable. Check bp weekly, let us know if consistently >140 and/or >90.  Follow-up: Return in about 4 weeks (around 03/16/2021) for LROB, CNM, in person.  No future appointments.  Orders Placed This Encounter  Procedures   GC/Chlamydia Probe Amp   Flu Vaccine QUAD 73mo+IM (Fluarix, Fluzone & Alfiuria Quad PF)   RPR   Cheral Marker CNM, [facility] 02/16/2021 12:14 PM

## 2021-02-16 NOTE — Progress Notes (Signed)
[place] 19+4 wks,cephalic,anterior placenta gr 0,normal ovaries,fhr 142 bpm,LVEICF 2.5 mm,cx 4.7 cm,EFW 350 g 86%,anatomy complete

## 2021-02-16 NOTE — Patient Instructions (Signed)
Lindsay Bryant, thank you for choosing our office today! We appreciate the opportunity to meet your healthcare needs. You may receive a short survey by mail, e-mail, or through [employer]. If you are happy with your care we would appreciate if you could take just a few minutes to complete the survey questions. We read all of your comments and take your feedback very seriously. Thank you again for choosing our office.  Center for [employer] Team at [facility] at [facility] ([address] [place], [place] [address]) Entrance C, located off of [address] Free 24/7 valet parking  Go to [email or web address] to register for FREE online childbirth classes  Call the office [phone]) or go to [facility] if: You begin to severe cramping Your water breaks.  Sometimes it is a big gush of fluid, sometimes it is just a trickle that keeps getting your panties wet or running down your legs You have vaginal bleeding.  It is normal to have a small amount of spotting if your cervix was checked.   [facility] Pediatricians/Family Doctors [facility]): [address] Colette Ribas, [phone]           [facility]: [address], [phone]                [facility]): [address], [phone] (call to ask if accepting patients) [facility] Department: [address], [place], [phone]    [facility] Pediatricians/Family Doctors [facility]): [phone], [phone] [facility]: [address] [place], [phone] [facility]: [address], [phone]  [facility] Doctors  [facility]): [phone] [facility]: [address] [phone]   [facility] Doctors [facility]: [address], [phone]  [facility] Doctors  [facility]: [phone], [phone]  Home Blood Pressure Monitoring for Patients   Your provider has recommended that you check your blood pressure (BP) at least once a week at home. If you do not have a blood pressure cuff at home, one will be provided for you. Contact your provider if you have not received your monitor within 1 week.   Helpful Tips for Accurate Home Blood Pressure Checks  Don't smoke, exercise, or drink caffeine 30 minutes before checking your BP Use the restroom before checking your BP (a full bladder can raise your pressure) Relax in a comfortable upright chair Feet on the ground Left arm resting comfortably on a flat surface at the level of your heart Legs uncrossed Back supported Sit quietly and don't talk Place the cuff on your bare arm Adjust snuggly, so that only two fingertips can fit between your skin and the top of the cuff Check 2 readings separated by at least one minute Keep a log of your BP readings For a visual, please reference this diagram: http://ccnc.care/bpdiagram  Provider Name: [facility]     Phone: [phone]  Zone 1: ALL CLEAR  Continue to monitor your symptoms:  BP reading is less than 140 (top number) or less than 90 (bottom number)  No right upper stomach pain No headaches or seeing spots No feeling nauseated or throwing up No swelling in face and hands  Zone 2: CAUTION Call your doctor's office for any of the following:  BP reading is greater than 140 (top number) or greater than  90 (bottom number)  Stomach pain under your ribs in the middle or right side Headaches or seeing spots Feeling nauseated or throwing up Swelling in face and hands  Zone 3: EMERGENCY  Seek immediate medical care if you have any of the following:  BP reading is greater than160 (top number) or greater than 110 (bottom number) Severe headaches not improving with Tylenol Serious difficulty catching your breath Any worsening symptoms from  Zone 2     Second Trimester of Pregnancy The second trimester is from week 14 through week 27 (months 4 through 6). The second trimester is often a time when you feel your best. Your body has adjusted to being pregnant, and you begin to feel better physically. Usually, morning sickness has lessened or quit completely, you may have more energy, and you may have an increase in appetite. The second trimester is also a time when the fetus is growing rapidly. At the end of the sixth month, the fetus is about 9 inches long and weighs about 1 pounds. You will likely begin to feel the baby move (quickening) between 16 and 20 weeks of pregnancy. Body changes during your second trimester Your body continues to go through many changes during your second trimester. The changes vary from woman to woman. Your weight will continue to increase. You will notice your lower abdomen bulging out. You may begin to get stretch marks on your hips, abdomen, and breasts. You may develop headaches that can be relieved by medicines. The medicines should be approved by your health care provider. You may urinate more often because the fetus is pressing on your bladder. You may develop or continue to have heartburn as a result of your pregnancy. You may develop constipation because certain hormones are causing the muscles that push waste through your intestines to slow down. You may develop hemorrhoids or swollen, bulging veins (varicose veins). You may have back pain. This is caused by: Weight gain. Pregnancy hormones that are relaxing the joints in your pelvis. A shift in weight and the muscles that support your balance. Your breasts will continue to grow and they will continue to become tender. Your gums may bleed and may be sensitive to brushing and flossing. Dark spots or blotches (chloasma, mask of pregnancy) may develop on your face. This will likely fade after the baby is born. A dark line from your belly button to  the pubic area (linea nigra) may appear. This will likely fade after the baby is born. You may have changes in your hair. These can include thickening of your hair, rapid growth, and changes in texture. Some women also have hair loss during or after pregnancy, or hair that feels dry or thin. Your hair will most likely return to normal after your baby is born.  What to expect at prenatal visits During a routine prenatal visit: You will be weighed to make sure you and the fetus are growing normally. Your blood pressure will be taken. Your abdomen will be measured to track your baby's growth. The fetal heartbeat will be listened to. Any test results from the previous visit will be discussed.  Your health care provider may ask you: How you are feeling. If you are feeling the baby move. If you have had any abnormal symptoms, such as leaking fluid, bleeding, severe headaches, or abdominal cramping. If you are using any tobacco products, including cigarettes, chewing tobacco, and electronic cigarettes. If you have any questions.  Other tests that may be performed during   your second trimester include: Blood tests that check for: Low iron levels (anemia). High blood sugar that affects pregnant women (gestational diabetes) between 24 and 28 weeks. Rh antibodies. This is to check for a protein on red blood cells (Rh factor). Urine tests to check for infections, diabetes, or protein in the urine. An ultrasound to confirm the proper growth and development of the baby. An amniocentesis to check for possible genetic problems. Fetal screens for spina bifida and Down syndrome. HIV (human immunodeficiency virus) testing. Routine prenatal testing includes screening for HIV, unless you choose not to have this test.  Follow these instructions at home: Medicines Follow your health care provider's instructions regarding medicine use. Specific medicines may be either safe or unsafe to take during  pregnancy. Take a prenatal vitamin that contains at least 600 micrograms (mcg) of folic acid. If you develop constipation, try taking a stool softener if your health care provider approves. Eating and drinking Eat a balanced diet that includes fresh fruits and vegetables, whole grains, good sources of protein such as meat, eggs, or tofu, and low-fat dairy. Your health care provider will help you determine the amount of weight gain that is right for you. Avoid raw meat and uncooked cheese. These carry germs that can cause birth defects in the baby. If you have low calcium intake from food, talk to your health care provider about whether you should take a daily calcium supplement. Limit foods that are high in fat and processed sugars, such as fried and sweet foods. To prevent constipation: Drink enough fluid to keep your urine clear or pale yellow. Eat foods that are high in fiber, such as fresh fruits and vegetables, whole grains, and beans. Activity Exercise only as directed by your health care provider. Most women can continue their usual exercise routine during pregnancy. Try to exercise for 30 minutes at least 5 days a week. Stop exercising if you experience uterine contractions. Avoid heavy lifting, wear low heel shoes, and practice good posture. A sexual relationship may be continued unless your health care provider directs you otherwise. Relieving pain and discomfort Wear a good support bra to prevent discomfort from breast tenderness. Take warm sitz baths to soothe any pain or discomfort caused by hemorrhoids. Use hemorrhoid cream if your health care provider approves. Rest with your legs elevated if you have leg cramps or low back pain. If you develop varicose veins, wear support hose. Elevate your feet for 15 minutes, 3-4 times a day. Limit salt in your diet. Prenatal Care Write down your questions. Take them to your prenatal visits. Keep all your prenatal visits as told by your health  care provider. This is important. Safety Wear your seat belt at all times when driving. Make a list of emergency phone numbers, including numbers for family, friends, the hospital, and police and fire departments. General instructions Ask your health care provider for a referral to a local prenatal education class. Begin classes no later than the beginning of month 6 of your pregnancy. Ask for help if you have counseling or nutritional needs during pregnancy. Your health care provider can offer advice or refer you to specialists for help with various needs. Do not use hot tubs, steam rooms, or saunas. Do not douche or use tampons or scented sanitary pads. Do not cross your legs for long periods of time. Avoid cat litter boxes and soil used by cats. These carry germs that can cause birth defects in the baby and possibly loss of the   fetus by miscarriage or stillbirth. Avoid all smoking, herbs, alcohol, and unprescribed drugs. Chemicals in these products can affect the formation and growth of the baby. Do not use any products that contain nicotine or tobacco, such as cigarettes and e-cigarettes. If you need help quitting, ask your health care provider. Visit your dentist if you have not gone yet during your pregnancy. Use a soft toothbrush to brush your teeth and be gentle when you floss. Contact a health care provider if: You have dizziness. You have mild pelvic cramps, pelvic pressure, or nagging pain in the abdominal area. You have persistent nausea, vomiting, or diarrhea. You have a bad smelling vaginal discharge. You have pain when you urinate. Get help right away if: You have a fever. You are leaking fluid from your vagina. You have spotting or bleeding from your vagina. You have severe abdominal cramping or pain. You have rapid weight gain or weight loss. You have shortness of breath with chest pain. You notice sudden or extreme swelling of your face, hands, ankles, feet, or legs. You  have not felt your baby move in over an hour. You have severe headaches that do not go away when you take medicine. You have vision changes. Summary The second trimester is from week 14 through week 27 (months 4 through 6). It is also a time when the fetus is growing rapidly. Your body goes through many changes during pregnancy. The changes vary from woman to woman. Avoid all smoking, herbs, alcohol, and unprescribed drugs. These chemicals affect the formation and growth your baby. Do not use any tobacco products, such as cigarettes, chewing tobacco, and e-cigarettes. If you need help quitting, ask your health care provider. Contact your health care provider if you have any questions. Keep all prenatal visits as told by your health care provider. This is important. This information is not intended to replace advice given to you by your health care provider. Make sure you discuss any questions you have with your health care provider. Document Released: 05/17/2001 Document Revised: 10/29/2015 Document Reviewed: 07/24/2012 Elsevier Interactive Patient Education  2017 [employer] Inc.  

## 2021-02-17 LAB — CERVICOVAGINAL ANCILLARY ONLY
Bacterial Vaginitis (gardnerella): POSITIVE — AB
Candida Glabrata: NEGATIVE
Candida Vaginitis: NEGATIVE
Chlamydia: NEGATIVE
Comment: NEGATIVE
Comment: NEGATIVE
Comment: NEGATIVE
Comment: NEGATIVE
Comment: NEGATIVE
Comment: NORMAL
Neisseria Gonorrhea: NEGATIVE
Trichomonas: NEGATIVE

## 2021-02-17 LAB — RPR: RPR Ser Ql: NONREACTIVE

## 2021-02-18 MED ORDER — METRONIDAZOLE 500 MG PO TABS: 500.0000 mg | ORAL_TABLET | Freq: Two times a day (BID) | ORAL | 0 refills | Status: DC

## 2021-02-18 NOTE — Addendum Note (Signed)
Addended by: Cheral Marker on: 02/18/2021 09:11 AM   Modules accepted: Orders

## 2021-03-17 ENCOUNTER — Encounter: Payer: Self-pay | Admitting: Women's Health

## 2021-03-17 ENCOUNTER — Ambulatory Visit (INDEPENDENT_AMBULATORY_CARE_PROVIDER_SITE_OTHER): Payer: Medicaid Other | Admitting: Women's Health

## 2021-03-17 ENCOUNTER — Other Ambulatory Visit: Payer: Self-pay

## 2021-03-17 VITALS — BP 126/74 | HR 95 | Wt 152.0 lb

## 2021-03-17 DIAGNOSIS — Z3480 Encounter for supervision of other normal pregnancy, unspecified trimester: Secondary | ICD-10-CM

## 2021-03-17 DIAGNOSIS — Z3482 Encounter for supervision of other normal pregnancy, second trimester: Secondary | ICD-10-CM

## 2021-03-17 NOTE — Progress Notes (Signed)
    LOW-RISK PREGNANCY VISIT Patient name: Lindsay Bryant MRN [id number]  Date of birth: 2000-11-05 Chief Complaint:   Routine Prenatal Visit  History of Present Illness:   Lindsay Bryant is a 20 y.o. G14P1001 female at [redacted]w[redacted]d with an Estimated Date of Delivery: 07/09/21 being seen today for ongoing management of a low-risk pregnancy.   Today she reports  sick 2wks ago, lingering congestion. RLQ/side pain- gone now, no fever/chills/n/v . Contractions: Not present. Vag. Bleeding: None.  Movement: Present. denies leaking of fluid.  Depression screen [facility] 2/9 12/28/2020 04/03/2019  Decreased Interest 3 0  Down, Depressed, Hopeless 3 0  PHQ - 2 Score 6 0  Altered sleeping 2 3  Tired, decreased energy 3 3  Change in appetite 2 0  Feeling bad or failure about yourself  3 0  Trouble concentrating 0 0  Moving slowly or fidgety/restless 0 0  Suicidal thoughts 1 0  PHQ-9 Score 17 6     GAD 7 : Generalized Anxiety Score 12/28/2020  Nervous, Anxious, on Edge 2  Control/stop worrying 2  Worry too much - different things 3  Trouble relaxing 2  Restless 0  Easily annoyed or irritable 3  Afraid - awful might happen 1  Total GAD 7 Score 13      Review of Systems:   Pertinent items are noted in HPI Denies abnormal vaginal discharge w/ itching/odor/irritation, headaches, visual changes, shortness of breath, chest pain, abdominal pain, severe nausea/vomiting, or problems with urination or bowel movements unless otherwise stated above. Pertinent History Reviewed:  Reviewed past medical,surgical, social, obstetrical and family history.  Reviewed problem list, medications and allergies. Physical Assessment:   Vitals:   03/17/21 1009  BP: 126/74  Pulse: 95  Weight: 152 lb (68.9 kg)  Body mass index is 23.81 kg/m.        Physical Examination:   General appearance: Well appearing, and in no distress  Mental status: Alert, oriented to person, place, and time  Skin: Warm & dry  Cardiovascular:  Normal heart rate noted  Respiratory: Normal respiratory effort, no distress  Abdomen: Soft, gravid, nontender  Pelvic: Cervical exam deferred         Extremities: Edema: None  Fetal Status: Fetal Heart Rate (bpm): 142 Fundal Height: 23 cm Movement: Present    Chaperone: N/A   No results found for this or any previous visit (from the past 24 hour(s)).  Assessment & Plan:  1) Low-risk pregnancy G2P1001 at [redacted]w[redacted]d with an Estimated Date of Delivery: 07/09/21   2) Congestion, can take otc decongestant  3) RLQ/side pain> resolved now, if returns and is constant/severe/n/v/fever/chills, etc let us know   Meds: No orders of the defined types were placed in this encounter.  Labs/procedures today: none  Plan:  Continue routine obstetrical care  Next visit: prefers will be in person for pn2     Reviewed: Preterm labor symptoms and general obstetric precautions including but not limited to vaginal bleeding, contractions, leaking of fluid and fetal movement were reviewed in detail with the patient.  All questions were answered. Does have home bp cuff. Office bp cuff given: not applicable. Check bp weekly, let us know if consistently >140 and/or >90.  Follow-up: Return in about 4 weeks (around 04/14/2021) for LROB, PN2, CNM, in person.  No future appointments.  No orders of the defined types were placed in this encounter.  Cheral Marker CNM, [facility] 03/17/2021 10:49 AM

## 2021-03-17 NOTE — Patient Instructions (Signed)
Melody, thank you for choosing our office today! We appreciate the opportunity to meet your healthcare needs. You may receive a short survey by mail, e-mail, or through [employer]. If you are happy with your care we would appreciate if you could take just a few minutes to complete the survey questions. We read all of your comments and take your feedback very seriously. Thank you again for choosing our office.  Center for [employer] Team at [facility]  [facility] at [facility] ([address] [place], [place] [address]) Entrance C, located off of [address] Free 24/7 valet parking   You will have your sugar test next visit.  Please do not eat or drink anything after midnight the night before you come, not even water.  You will be here for at least two hours.  Please make an appointment online for the bloodwork at [email or web address] for 8:00am (or as close to this as possible). Make sure you select the [facility] service center.   CLASSES: Go to [email or web address] to register for classes (childbirth, breastfeeding, waterbirth, infant CPR, daddy bootcamp, etc.)  Call the office [phone]) or go to [facility] if: You begin to have strong, frequent contractions Your water breaks.  Sometimes it is a big gush of fluid, sometimes it is just a trickle that keeps getting your panties wet or running down your legs You have vaginal bleeding.  It is normal to have a small amount of spotting if your cervix was checked.  You don't feel your baby moving like normal.  If you don't, get you something to eat and drink and lay down and focus on feeling your baby move.   If your baby is still not moving like normal, you should call the office or go to [facility].  Call the office [phone]) or go to [facility] hospital for these signs of pre-eclampsia: Severe headache that does not go away with Tylenol Visual changes- seeing spots, double, blurred vision Pain under your right breast or upper  abdomen that does not go away with Tums or heartburn medicine Nausea and/or vomiting Severe swelling in your hands, feet, and face    [facility] Pediatricians/Family Doctors [facility]): [address] [place], [phone]           [facility]: [address], [phone]                [facility]): [address], [phone] (call to ask if accepting patients) [facility] Department: [address] [place], [phone]    [facility] Pediatricians/Family Doctors [facility]): [address], [phone] [facility]: [address] [place], [phone] [facility]: [address], [phone]  [facility] Doctors  [facility]): [phone] [facility]: [address] [phone]   [facility] Doctors [facility]: [address] [phone]  [facility] Doctors  [facility]: [phone], [phone]  Home Blood Pressure Monitoring for Patients   Your provider has recommended that you check your blood pressure (BP) at least once a week at home. If you do not have a blood pressure cuff at home, one will be provided for you. Contact your provider if you have not received your monitor within 1 week.   Helpful Tips for Accurate Home Blood Pressure Checks  Don't smoke, exercise, or drink  caffeine 30 minutes before checking your BP Use the restroom before checking your BP (a full bladder can raise your pressure) Relax in a comfortable upright chair Feet on the ground Left arm resting comfortably on a flat surface at the level of your heart Legs uncrossed Back supported Sit quietly and don't talk Place the cuff on your bare arm Adjust snuggly, so that only two fingertips can fit between your skin and the top of the cuff Check 2  readings separated by at least one minute Keep a log of your BP readings For a visual, please reference this diagram: http://ccnc.care/bpdiagram  Provider Name: [facility]     Phone: [phone]  Zone 1: ALL CLEAR  Continue to monitor your symptoms:  BP reading is less than 140 (top number) or less than 90 (bottom number)  No right upper stomach pain No headaches or seeing spots No feeling nauseated or throwing up No swelling in face and hands  Zone 2: CAUTION Call your doctor's office for any of the following:  BP reading is greater than 140 (top number) or greater than 90 (bottom number)  Stomach pain under your ribs in the middle or right side Headaches or seeing spots Feeling nauseated or throwing up Swelling in face and hands  Zone 3: EMERGENCY  Seek immediate medical care if you have any of the following:  BP reading is greater than160 (top number) or greater than 110 (bottom number) Severe headaches not improving with Tylenol Serious difficulty catching your breath Any worsening symptoms from Zone 2   Second Trimester of Pregnancy The second trimester is from week 13 through week 28, months 4 through 6. The second trimester is often a time when you feel your best. Your body has also adjusted to being pregnant, and you begin to feel better physically. Usually, morning sickness has lessened or quit completely, you may have more energy, and you may have an increase in appetite. The second trimester is also a time when the fetus is growing rapidly. At the end of the sixth month, the fetus is about 9 inches long and weighs about 1 pounds. You will likely begin to feel the baby move (quickening) between 18 and 20 weeks of the pregnancy. BODY CHANGES Your body goes through many changes during pregnancy. The changes vary from woman to woman.  Your weight will continue to increase. You will notice your lower abdomen bulging out. You may begin to get stretch marks on your  hips, abdomen, and breasts. You may develop headaches that can be relieved by medicines approved by your health care provider. You may urinate more often because the fetus is pressing on your bladder. You may develop or continue to have heartburn as a result of your pregnancy. You may develop constipation because certain hormones are causing the muscles that push waste through your intestines to slow down. You may develop hemorrhoids or swollen, bulging veins (varicose veins). You may have back pain because of the weight gain and pregnancy hormones relaxing your joints between the bones in your pelvis and as a result of a shift in weight and the muscles that support your balance. Your breasts will continue to grow and be tender. Your gums may bleed and may be sensitive to brushing and flossing. Dark spots or blotches (chloasma, mask of pregnancy) may develop on your face. This will likely fade after the baby is born. A dark line from your belly button to the pubic area (linea nigra) may appear. This  will likely fade after the baby is born. You may have changes in your hair. These can include thickening of your hair, rapid growth, and changes in texture. Some women also have hair loss during or after pregnancy, or hair that feels dry or thin. Your hair will most likely return to normal after your baby is born. WHAT TO EXPECT AT YOUR PRENATAL VISITS During a routine prenatal visit: You will be weighed to make sure you and the fetus are growing normally. Your blood pressure will be taken. Your abdomen will be measured to track your baby's growth. The fetal heartbeat will be listened to. Any test results from the previous visit will be discussed. Your health care provider may ask you: How you are feeling. If you are feeling the baby move. If you have had any abnormal symptoms, such as leaking fluid, bleeding, severe headaches, or abdominal cramping. If you have any questions. Other tests that may  be performed during your second trimester include: Blood tests that check for: Low iron levels (anemia). Gestational diabetes (between 24 and 28 weeks). Rh antibodies. Urine tests to check for infections, diabetes, or protein in the urine. An ultrasound to confirm the proper growth and development of the baby. An amniocentesis to check for possible genetic problems. Fetal screens for spina bifida and Down syndrome. HOME CARE INSTRUCTIONS  Avoid all smoking, herbs, alcohol, and unprescribed drugs. These chemicals affect the formation and growth of the baby. Follow your health care provider's instructions regarding medicine use. There are medicines that are either safe or unsafe to take during pregnancy. Exercise only as directed by your health care provider. Experiencing uterine cramps is a good sign to stop exercising. Continue to eat regular, healthy meals. Wear a good support bra for breast tenderness. Do not use hot tubs, steam rooms, or saunas. Wear your seat belt at all times when driving. Avoid raw meat, uncooked cheese, cat litter boxes, and soil used by cats. These carry germs that can cause birth defects in the baby. Take your prenatal vitamins. Try taking a stool softener (if your health care provider approves) if you develop constipation. Eat more high-fiber foods, such as fresh vegetables or fruit and whole grains. Drink plenty of fluids to keep your urine clear or pale yellow. Take warm sitz baths to soothe any pain or discomfort caused by hemorrhoids. Use hemorrhoid cream if your health care provider approves. If you develop varicose veins, wear support hose. Elevate your feet for 15 minutes, 3-4 times a day. Limit salt in your diet. Avoid heavy lifting, wear low heel shoes, and practice good posture. Rest with your legs elevated if you have leg cramps or low back pain. Visit your dentist if you have not gone yet during your pregnancy. Use a soft toothbrush to brush your teeth  and be gentle when you floss. A sexual relationship may be continued unless your health care provider directs you otherwise. Continue to go to all your prenatal visits as directed by your health care provider. SEEK MEDICAL CARE IF:  You have dizziness. You have mild pelvic cramps, pelvic pressure, or nagging pain in the abdominal area. You have persistent nausea, vomiting, or diarrhea. You have a bad smelling vaginal discharge. You have pain with urination. SEEK IMMEDIATE MEDICAL CARE IF:  You have a fever. You are leaking fluid from your vagina. You have spotting or bleeding from your vagina. You have severe abdominal cramping or pain. You have rapid weight gain or loss. You have shortness of   breath with chest pain. You notice sudden or extreme swelling of your face, hands, ankles, feet, or legs. You have not felt your baby move in over an hour. You have severe headaches that do not go away with medicine. You have vision changes. Document Released: 05/17/2001 Document Revised: 05/28/2013 Document Reviewed: 07/24/2012 [facility] Patient Information 2015 [place], [place]. This information is not intended to replace advice given to you by your health care provider. Make sure you discuss any questions you have with your health care provider.

## 2021-03-28 ENCOUNTER — Other Ambulatory Visit: Payer: Self-pay

## 2021-03-28 ENCOUNTER — Ambulatory Visit
Admission: RE | Admit: 2021-03-28 | Discharge: 2021-03-28 | Disposition: A | Discharge status: Home / Self Care | Payer: Medicaid Other | Source: Ambulatory Visit | Attending: Family Medicine | Admitting: Family Medicine

## 2021-03-28 VITALS — BP 105/75 | HR 103 | Temp 98.6°F | Resp 18 | Wt 150.0 lb

## 2021-03-28 DIAGNOSIS — L0291 Cutaneous abscess, unspecified: Secondary | ICD-10-CM

## 2021-03-28 DIAGNOSIS — J0100 Acute maxillary sinusitis, unspecified: Secondary | ICD-10-CM | POA: Diagnosis not present

## 2021-03-28 DIAGNOSIS — J3089 Other allergic rhinitis: Secondary | ICD-10-CM

## 2021-03-28 MED ORDER — CLINDAMYCIN HCL 300 MG PO CAPS: 300.0000 mg | ORAL_CAPSULE | Freq: Two times a day (BID) | ORAL | 0 refills | Status: DC

## 2021-03-28 NOTE — ED Triage Notes (Signed)
For about a week has had a bump on left upper leg that has gotten red around and swollen.

## 2021-03-28 NOTE — ED Provider Notes (Signed)
[facility]    CSN: [id number] Arrival date & time: 03/28/21  0850      History   Chief Complaint Chief Complaint  Patient presents with   Abscess    HPI Lindsay Bryant is a 20 y.o. female.   Presenting today with a painful red and swollen bump on the right upper lateral leg for about a week now that has been getting larger over time.  She denies drainage, fevers, chills, body aches, injury or insect bite to the area.  States her son just had an issue with a MRSA infection not too long ago so she is very concerned about the area.  Has not been trying anything over-the-counter for symptoms thus far.  She is also dealing with several weeks of worsening sinus pressure, pain, nasal congestion and inflammation the past 3 weeks.  She states she had a viral upper respiratory infection at that time and the other symptoms have cleared but that has remained.  Has known history of seasonal allergies on antihistamines and nasal sprays and has been taking over-the-counter decongestants additionally with minimal relief.   Past Medical History:  Diagnosis Date   Anemia     Patient Active Problem List   Diagnosis Date Noted   Chlamydia 01/11/2021   Rh negative state in antepartum period 12/28/2020   Encounter for supervision of normal pregnancy, antepartum 12/23/2020   Dyspareunia in female 02/05/2020    Past Surgical History:  Procedure Laterality Date   TONSILLECTOMY      OB History     Gravida  2   Para  1   Term  1   Preterm      AB      Living  1      SAB      IAB      Ectopic      Multiple  0   Live Births  1            Home Medications    Prior to Admission medications   Medication Sig Start Date End Date Taking? Authorizing Provider  clindamycin (CLEOCIN) 300 MG capsule Take 1 capsule (300 mg total) by mouth 2 (two) times daily. 03/28/21  Yes Particia Nearing, PA-C  metroNIDAZOLE (FLAGYL) 500 MG tablet Take 1 tablet (500 mg total)  by mouth 2 (two) times daily. Patient not taking: Reported on 03/17/2021 02/18/21   Cheral Marker, CNM  Prenatal Vit-Fe Fumarate-FA (PRENATAL VITAMIN PO) Take by mouth.    [provider]  promethazine (PHENERGAN) 25 MG tablet Take 1 tablet (25 mg total) by mouth every 6 (six) hours as needed for nausea or vomiting. Patient not taking: No sig reported 11/23/20   Adline Potter, NP    Family History Family History  Adopted: Yes    Social History Social History   Tobacco Use   Smoking status: Never   Smokeless tobacco: Never  Vaping Use   Vaping Use: Never used  Substance Use Topics   Alcohol use: Never   Drug use: Never     Allergies   Patient has no known allergies.   Review of Systems Review of Systems Per HPI  Physical Exam Triage Vital Signs ED Triage Vitals [03/28/21 0910]  Enc Vitals Group     BP 105/75     Pulse Rate (!) 103     Resp 18     Temp 98.6 F (37 C)     Temp Source Oral  SpO2 97 %     Weight 150 lb (68 kg)     Height      Head Circumference      Peak Flow      Pain Score 0     Pain Loc      Pain Edu?      Excl. in GC?    No data found.  Updated Vital Signs BP 105/75 (BP Location: Right Arm)   Pulse (!) 103   Temp 98.6 F (37 C) (Oral)   Resp 18   Wt 150 lb (68 kg)   LMP 10/02/2020 (Exact Date)   SpO2 97%   BMI 23.49 kg/m   Visual Acuity Right Eye Distance:   Left Eye Distance:   Bilateral Distance:    Right Eye Near:   Left Eye Near:    Bilateral Near:     Physical Exam Vitals and nursing note reviewed.  Constitutional:      Appearance: Normal appearance. She is not ill-appearing.  HENT:     Head: Atraumatic.     Nose: Congestion present.     Comments: Significant edema and erythema of bilateral nasal turbinates, thick exudates present, bilateral maxillary sinuses tender to palpation    Mouth/Throat:     Mouth: Mucous membranes are moist.     Pharynx: Posterior oropharyngeal erythema present.   Eyes:     Extraocular Movements: Extraocular movements intact.     Conjunctiva/sclera: Conjunctivae normal.  Cardiovascular:     Rate and Rhythm: Normal rate and regular rhythm.     Heart sounds: Normal heart sounds.  Pulmonary:     Effort: Pulmonary effort is normal. No respiratory distress.     Breath sounds: Normal breath sounds. No wheezing or rales.  Musculoskeletal:        General: Normal range of motion.     Cervical back: Normal range of motion and neck supple.  Skin:    General: Skin is warm and dry.     Comments: 1.5 cm firm significantly tender area of erythema and edema right lateral upper leg, no induration or fluctuance, no active drainage  Neurological:     Mental Status: She is alert and oriented to person, place, and time.  Psychiatric:        Mood and Affect: Mood normal.        Thought Content: Thought content normal.        Judgment: Judgment normal.     UC Treatments / Results  Labs (all labs ordered are listed, but only abnormal results are displayed) Labs Reviewed - No data to display  EKG   Radiology No results found.  Procedures Procedures (including critical care time)  Medications Ordered in UC Medications - No data to display  Initial Impression / Assessment and Plan / UC Course  I have reviewed the triage vital signs and the nursing notes.  Pertinent labs & imaging results that were available during my care of the patient were reviewed by me and considered in my medical decision making (see chart for details).     Given her recent exposure to MRSA, will cover for this with clindamycin and discussed warm compresses and home wound care.  Strict return precautions for worsening symptoms reviewed.  Discussed continue nasal sprays, sinus rinses, allergy regimen for ongoing sinus issues.  Return for acutely worsening symptoms  Final Clinical Impressions(s) / UC Diagnoses   Final diagnoses:  Abscess  Acute non-recurrent maxillary sinusitis   Seasonal allergic rhinitis due to  other allergic trigger   Discharge Instructions   None    ED Prescriptions     Medication Sig Dispense Auth. Provider   clindamycin (CLEOCIN) 300 MG capsule Take 1 capsule (300 mg total) by mouth 2 (two) times daily. 14 capsule Particia Nearing, [place]      PDMP not reviewed this encounter.   Roosvelt Maser [place], [place] 03/28/21 [phone]

## 2021-04-14 ENCOUNTER — Other Ambulatory Visit: Payer: Medicaid Other

## 2021-04-14 ENCOUNTER — Other Ambulatory Visit: Payer: Self-pay

## 2021-04-14 ENCOUNTER — Encounter: Payer: Self-pay | Admitting: Women's Health

## 2021-04-14 ENCOUNTER — Ambulatory Visit (INDEPENDENT_AMBULATORY_CARE_PROVIDER_SITE_OTHER): Payer: Medicaid Other | Admitting: Women's Health

## 2021-04-14 VITALS — BP 117/67 | HR 90 | Wt 155.0 lb

## 2021-04-14 DIAGNOSIS — Z3480 Encounter for supervision of other normal pregnancy, unspecified trimester: Secondary | ICD-10-CM | POA: Diagnosis not present

## 2021-04-14 DIAGNOSIS — Z3482 Encounter for supervision of other normal pregnancy, second trimester: Secondary | ICD-10-CM

## 2021-04-14 DIAGNOSIS — Z3A27 27 weeks gestation of pregnancy: Secondary | ICD-10-CM

## 2021-04-14 DIAGNOSIS — Z23 Encounter for immunization: Secondary | ICD-10-CM | POA: Diagnosis not present

## 2021-04-14 NOTE — Progress Notes (Signed)
    LOW-RISK PREGNANCY VISIT Patient name: Lindsay Bryant MRN [id number]  Date of birth: 2000-10-19 Chief Complaint:   Routine Prenatal Visit  History of Present Illness:   Lindsay Bryant is a 20 y.o. G21P1001 female at [redacted]w[redacted]d with an Estimated Date of Delivery: 07/09/21 being seen today for ongoing management of a low-risk pregnancy.   Today she reports dizziness. Contractions: Not present. Vag. Bleeding: None.  Movement: Present. denies leaking of fluid.  Depression screen [facility] 2/9 04/14/2021 12/28/2020 04/03/2019  Decreased Interest 3 3 0  Down, Depressed, Hopeless 2 3 0  PHQ - 2 Score 5 6 0  Altered sleeping 3 2 3   Tired, decreased energy 3 3 3   Change in appetite 0 2 0  Feeling bad or failure about yourself  2 3 0  Trouble concentrating 1 0 0  Moving slowly or fidgety/restless 0 0 0  Suicidal thoughts 1 1 0  PHQ-9 Score 15 17 6      GAD 7 : Generalized Anxiety Score 04/14/2021 12/28/2020  Nervous, Anxious, on Edge 2 2  Control/stop worrying 2 2  Worry too much - different things 3 3  Trouble relaxing 1 2  Restless 0 0  Easily annoyed or irritable 2 3  Afraid - awful might happen 1 1  Total GAD 7 Score 11 13      Review of Systems:   Pertinent items are noted in HPI Denies abnormal vaginal discharge w/ itching/odor/irritation, headaches, visual changes, shortness of breath, chest pain, abdominal pain, severe nausea/vomiting, or problems with urination or bowel movements unless otherwise stated above. Pertinent History Reviewed:  Reviewed past medical,surgical, social, obstetrical and family history.  Reviewed problem list, medications and allergies. Physical Assessment:   Vitals:   04/14/21 0900  BP: 117/67  Pulse: 90  Weight: 155 lb (70.3 kg)  Body mass index is 24.28 kg/m.        Physical Examination:   General appearance: Well appearing, and in no distress  Mental status: Alert, oriented to person, place, and time  Skin: Warm & dry  Cardiovascular: Normal heart rate  noted  Respiratory: Normal respiratory effort, no distress  Abdomen: Soft, gravid, nontender  Pelvic: Cervical exam deferred         Extremities: Edema: None  Fetal Status: Fetal Heart Rate (bpm): 138 Fundal Height: 27 cm Movement: Present    Chaperone: N/A   No results found for this or any previous visit (from the past 24 hour(s)).  Assessment & Plan:  1) Low-risk pregnancy G2P1001 at [redacted]w[redacted]d with an Estimated Date of Delivery: 07/09/21   2) Dizzy spells, checking hgb today, discussed and gave printed prevention/relief measures    Meds: No orders of the defined types were placed in this encounter.  Labs/procedures today: tdap and PN2  Plan:  Continue routine obstetrical care  Next visit: prefers in person    Reviewed: Preterm labor symptoms and general obstetric precautions including but not limited to vaginal bleeding, contractions, leaking of fluid and fetal movement were reviewed in detail with the patient.  All questions were answered. Does have home bp cuff. Office bp cuff given: not applicable. Check bp weekly, let 13/09/22 know if consistently >140 and/or >90.  Follow-up: Return in about 4 weeks (around 05/12/2021) for LROB, CNM, in person.  No future appointments.  No orders of the defined types were placed in this encounter.  09/06/21 CNM, [facility] 04/14/2021 9:21 AM

## 2021-04-14 NOTE — Patient Instructions (Addendum)
Lindsay Bryant, thank you for choosing our office today! We appreciate the opportunity to meet your healthcare needs. You may receive a short survey by mail, e-mail, or through [employer]. If you are happy with your care we would appreciate if you could take just a few minutes to complete the survey questions. We read all of your comments and take your feedback very seriously. Thank you again for choosing our office.  Center for [employer] Team at [facility]  [facility] at [facility] ([address] [place], [place] [address]) Entrance C, located off of [address] Free 24/7 valet parking   For Dizzy Spells:  This is usually related to either your blood sugar or your blood pressure dropping Make sure you are staying well hydrated and drinking enough water so that your urine is clear Eat small frequent meals and snacks containing protein (meat, eggs, nuts, cheese) so that your blood sugar doesn't drop If you do get dizzy, sit/lay down and get you something to drink and a snack containing protein- you will usually start feeling better in 10-20 minutes    CLASSES: Go to [email or web address] to register for classes (childbirth, breastfeeding, waterbirth, infant CPR, daddy bootcamp, etc.)  Call the office [phone]) or go to [facility] if: You begin to have strong, frequent contractions Your water breaks.  Sometimes it is a big gush of fluid, sometimes it is just a trickle that keeps getting your panties wet or running down your legs You have vaginal bleeding.  It is normal to have a small amount of spotting if your cervix was checked.  You don't feel your baby moving like normal.  If you don't, get you something to eat and drink and lay down and focus on feeling your baby move.   If your baby is still not moving like normal, you should call the office or go to [facility].  Call the office [phone]) or go to [facility] hospital for these signs of pre-eclampsia: Severe  headache that does not go away with Tylenol Visual changes- seeing spots, double, blurred vision Pain under your right breast or upper abdomen that does not go away with Tums or heartburn medicine Nausea and/or vomiting Severe swelling in your hands, feet, and face   Tdap Vaccine It is recommended that you get the Tdap vaccine during the third trimester of EACH pregnancy to help protect your baby from getting pertussis (whooping cough) 27-36 weeks is the BEST time to do this so that you can pass the protection on to your baby. During pregnancy is better than after pregnancy, but if you are unable to get it during pregnancy it will be offered at the hospital.  You can get this vaccine with [place], at the health department, your family doctor, or some local pharmacies Everyone who will be around your baby should also be up-to-date on their vaccines before the baby comes. Adults (who are not pregnant) only need 1 dose of Tdap during adulthood.   [facility] Pediatricians/Family Doctors [facility]): [address] [place] Rib[place], [phone]           [facility]: [address], [phone]                [facility]): [address], [phone] (call to ask if accepting patients) [facility] Department: [address], [place], [phone]    [facility] Pediatricians/Family Doctors [facility]): [phone] [address]  [address], [place]: [address] [place], [place] [facility]: [place], [place] [facility]): [phone] [facility]: [address], [place]: [address] [place] [facility]: [phone], [phone]  Home Blood Pressure Monitoring  for Patients   Your provider has recommended that you check your blood pressure (BP) at least once a week at home. If you do not have a blood pressure cuff at home, one will be provided for you. Contact your provider if you have not received your monitor within 1 week.   Helpful Tips for Accurate Home Blood Pressure Checks  Don't smoke, exercise, or drink caffeine 30 minutes before checking your BP Use the restroom before checking your BP (a full bladder can raise your pressure) Relax in a comfortable upright chair Feet on the ground Left arm resting comfortably on a flat surface at the level of your heart Legs uncrossed Back supported Sit quietly and don't talk Place the cuff on your bare arm Adjust snuggly, so that only two fingertips can fit between your skin and the top of the cuff Check 2 readings separated by at least one minute Keep a log of your BP readings For a visual, please reference this diagram: http://ccnc.care/bpdiagram  Provider Name: [facility]     Phone: [phone]  Zone 1: ALL CLEAR  Continue to monitor your symptoms:  BP reading is less than 140 (top number) or less than 90 (bottom number)  No right upper stomach pain No headaches or seeing spots No feeling nauseated or throwing up No swelling in face and hands  Zone 2: CAUTION Call your doctor's office for any of the following:  BP reading is greater than 140 (top number) or greater than 90 (bottom number)  Stomach pain under your ribs in the middle or right side Headaches or seeing spots Feeling nauseated or throwing up Swelling in face and hands  Zone 3: EMERGENCY  Seek immediate medical care if you have any of the following:  BP reading is greater than160 (top number) or greater than 110 (bottom number) Severe headaches not improving with Tylenol Serious difficulty catching your breath Any worsening symptoms from Zone 2   Third Trimester of Pregnancy The third trimester is from week 29  through week 42, months 7 through 9. The third trimester is a time when the fetus is growing rapidly. At the end of the ninth month, the fetus is about 20 inches in length and weighs 6-10 pounds.  BODY CHANGES Your body goes through many changes during pregnancy. The changes vary from woman to woman.  Your weight will continue to increase. You can expect to gain 25-35 pounds (11-16 kg) by the end of the pregnancy. You may begin to get stretch marks on your hips, abdomen, and breasts. You may urinate more often because the fetus is moving lower into your pelvis and pressing on your bladder. You may develop or continue to have heartburn as a result of your pregnancy. You may develop constipation because certain hormones are causing the muscles that push waste through your intestines to slow down. You may develop hemorrhoids or swollen, bulging veins (varicose veins). You may have pelvic pain because of the weight gain and pregnancy hormones relaxing your joints between the bones in your pelvis. Backaches may result from  overexertion of the muscles supporting your posture. You may have changes in your hair. These can include thickening of your hair, rapid growth, and changes in texture. Some women also have hair loss during or after pregnancy, or hair that feels dry or thin. Your hair will most likely return to normal after your baby is born. Your breasts will continue to grow and be tender. A yellow discharge may leak from your breasts called colostrum. Your belly button may stick out. You may feel short of breath because of your expanding uterus. You may notice the fetus "dropping," or moving lower in your abdomen. You may have a bloody mucus discharge. This usually occurs a few days to a week before labor begins. Your cervix becomes thin and soft (effaced) near your due date. WHAT TO EXPECT AT YOUR PRENATAL EXAMS  You will have prenatal exams every 2 weeks until week 36. Then, you will have weekly  prenatal exams. During a routine prenatal visit: You will be weighed to make sure you and the fetus are growing normally. Your blood pressure is taken. Your abdomen will be measured to track your baby's growth. The fetal heartbeat will be listened to. Any test results from the previous visit will be discussed. You may have a cervical check near your due date to see if you have effaced. At around 36 weeks, your caregiver will check your cervix. At the same time, your caregiver will also perform a test on the secretions of the vaginal tissue. This test is to determine if a type of bacteria, Group B streptococcus, is present. Your caregiver will explain this further. Your caregiver may ask you: What your birth plan is. How you are feeling. If you are feeling the baby move. If you have had any abnormal symptoms, such as leaking fluid, bleeding, severe headaches, or abdominal cramping. If you have any questions. Other tests or screenings that may be performed during your third trimester include: Blood tests that check for low iron levels (anemia). Fetal testing to check the health, activity level, and growth of the fetus. Testing is done if you have certain medical conditions or if there are problems during the pregnancy. FALSE LABOR You may feel small, irregular contractions that eventually go away. These are called Braxton Hicks contractions, or false labor. Contractions may last for hours, days, or even weeks before true labor sets in. If contractions come at regular intervals, intensify, or become painful, it is best to be seen by your caregiver.  SIGNS OF LABOR  Menstrual-like cramps. Contractions that are 5 minutes apart or less. Contractions that start on the top of the uterus and spread down to the lower abdomen and back. A sense of increased pelvic pressure or back pain. A watery or bloody mucus discharge that comes from the vagina. If you have any of these signs before the 37th week of  pregnancy, call your caregiver right away. You need to go to the hospital to get checked immediately. HOME CARE INSTRUCTIONS  Avoid all smoking, herbs, alcohol, and unprescribed drugs. These chemicals affect the formation and growth of the baby. Follow your caregiver's instructions regarding medicine use. There are medicines that are either safe or unsafe to take during pregnancy. Exercise only as directed by your caregiver. Experiencing uterine cramps is a good sign to stop exercising. Continue to eat regular, healthy meals. Wear a good support bra for breast tenderness. Do not use hot tubs, steam rooms, or saunas. Wear your seat belt at all times when  driving. Avoid raw meat, uncooked cheese, cat litter boxes, and soil used by cats. These carry germs that can cause birth defects in the baby. Take your prenatal vitamins. Try taking a stool softener (if your caregiver approves) if you develop constipation. Eat more high-fiber foods, such as fresh vegetables or fruit and whole grains. Drink plenty of fluids to keep your urine clear or pale yellow. Take warm sitz baths to soothe any pain or discomfort caused by hemorrhoids. Use hemorrhoid cream if your caregiver approves. If you develop varicose veins, wear support hose. Elevate your feet for 15 minutes, 3-4 times a day. Limit salt in your diet. Avoid heavy lifting, wear low heal shoes, and practice good posture. Rest a lot with your legs elevated if you have leg cramps or low back pain. Visit your dentist if you have not gone during your pregnancy. Use a soft toothbrush to brush your teeth and be gentle when you floss. A sexual relationship may be continued unless your caregiver directs you otherwise. Do not travel far distances unless it is absolutely necessary and only with the approval of your caregiver. Take prenatal classes to understand, practice, and ask questions about the labor and delivery. Make a trial run to the hospital. Pack your  hospital bag. Prepare the baby's nursery. Continue to go to all your prenatal visits as directed by your caregiver. SEEK MEDICAL CARE IF: You are unsure if you are in labor or if your water has broken. You have dizziness. You have mild pelvic cramps, pelvic pressure, or nagging pain in your abdominal area. You have persistent nausea, vomiting, or diarrhea. You have a bad smelling vaginal discharge. You have pain with urination. SEEK IMMEDIATE MEDICAL CARE IF:  You have a fever. You are leaking fluid from your vagina. You have spotting or bleeding from your vagina. You have severe abdominal cramping or pain. You have rapid weight loss or gain. You have shortness of breath with chest pain. You notice sudden or extreme swelling of your face, hands, ankles, feet, or legs. You have not felt your baby move in over an hour. You have severe headaches that do not go away with medicine. You have vision changes. Document Released: 05/17/2001 Document Revised: 05/28/2013 Document Reviewed: 07/24/2012 [facility] Patient Information 2015 [place], [place]. This information is not intended to replace advice given to you by your health care provider. Make sure you discuss any questions you have with your health care provider.

## 2021-04-16 LAB — CBC
Hematocrit: 34 % (ref 34.0–46.6)
Hemoglobin: 11.6 g/dL (ref 11.1–15.9)
MCH: 30.8 pg (ref 26.6–33.0)
MCHC: 34.1 g/dL (ref 31.5–35.7)
MCV: 90 fL (ref 79–97)
Platelets: 206 10*3/uL (ref 150–450)
RBC: 3.77 x10E6/uL (ref 3.77–5.28)
RDW: 13.3 % (ref 11.7–15.4)
WBC: 11 10*3/uL — ABNORMAL HIGH (ref 3.4–10.8)

## 2021-04-16 LAB — RPR, QUANT+TP ABS (REFLEX)
Rapid Plasma Reagin, Quant: 1:2 {titer} — ABNORMAL HIGH
T Pallidum Abs: NONREACTIVE

## 2021-04-16 LAB — RPR: RPR Ser Ql: REACTIVE — AB

## 2021-04-16 LAB — ANTIBODY SCREEN: Antibody Screen: NEGATIVE

## 2021-04-16 LAB — HIV ANTIBODY (ROUTINE TESTING W REFLEX): HIV Screen 4th Generation wRfx: NONREACTIVE

## 2021-04-16 LAB — GLUCOSE TOLERANCE, 2 HOURS W/ 1HR
Glucose, 1 hour: 82 mg/dL (ref 70–179)
Glucose, 2 hour: 72 mg/dL (ref 70–152)
Glucose, Fasting: 64 mg/dL — ABNORMAL LOW (ref 70–91)

## 2021-05-12 ENCOUNTER — Ambulatory Visit (INDEPENDENT_AMBULATORY_CARE_PROVIDER_SITE_OTHER): Payer: Medicaid Other | Admitting: Advanced Practice Midwife

## 2021-05-12 ENCOUNTER — Other Ambulatory Visit: Payer: Self-pay

## 2021-05-12 VITALS — BP 115/74 | HR 92 | Wt 163.0 lb

## 2021-05-12 DIAGNOSIS — Z3480 Encounter for supervision of other normal pregnancy, unspecified trimester: Secondary | ICD-10-CM

## 2021-05-12 DIAGNOSIS — Z6791 Unspecified blood type, Rh negative: Secondary | ICD-10-CM | POA: Diagnosis not present

## 2021-05-12 DIAGNOSIS — O26899 Other specified pregnancy related conditions, unspecified trimester: Secondary | ICD-10-CM

## 2021-05-12 DIAGNOSIS — Z3A31 31 weeks gestation of pregnancy: Secondary | ICD-10-CM

## 2021-05-12 NOTE — Patient Instructions (Signed)
Lindsay Bryant, thank you for choosing our office today! We appreciate the opportunity to meet your healthcare needs. You may receive a short survey by mail, e-mail, or through [employer]. If you are happy with your care we would appreciate if you could take just a few minutes to complete the survey questions. We read all of your comments and take your feedback very seriously. Thank you again for choosing our office.  Center for [employer] Team at [facility]  [facility] at [facility] ([address] [place], [place] [address]) Entrance C, located off of [address] Free 24/7 valet parking   CLASSES: Go to [email or web address] to register for classes (childbirth, breastfeeding, waterbirth, infant CPR, daddy bootcamp, etc.)  Call the office [phone]) or go to [facility] if: You begin to have strong, frequent contractions Your water breaks.  Sometimes it is a big gush of fluid, sometimes it is just a trickle that keeps getting your panties wet or running down your legs You have vaginal bleeding.  It is normal to have a small amount of spotting if your cervix was checked.  You don't feel your baby moving like normal.  If you don't, get you something to eat and drink and lay down and focus on feeling your baby move.   If your baby is still not moving like normal, you should call the office or go to [facility].  Call the office [phone]) or go to [facility] hospital for these signs of pre-eclampsia: Severe headache that does not go away with Tylenol Visual changes- seeing spots, double, blurred vision Pain under your right breast or upper abdomen that does not go away with Tums or heartburn medicine Nausea and/or vomiting Severe swelling in your hands, feet, and face   Tdap Vaccine It is recommended that you get the Tdap vaccine during the third trimester of EACH pregnancy to help protect your baby from getting pertussis (whooping cough) 27-36 weeks is the BEST time to do  this so that you can pass the protection on to your baby. During pregnancy is better than after pregnancy, but if you are unable to get it during pregnancy it will be offered at the hospital.  You can get this vaccine with [place], at the health department, your family doctor, or some local pharmacies Everyone who will be around your baby should also be up-to-date on their vaccines before the baby comes. Adults (who are not pregnant) only need 1 dose of Tdap during adulthood.   [facility] Pediatricians/Family Doctors [facility]): [address] [place], [phone]           [facility]: [address], [phone]                [facility]): [address], [phone] (call to ask if accepting patients) [facility] Department: [address], [place], [phone]    [facility] Pediatricians/Family Doctors [facility]): [phone] [address], [phone] [facility]: [address] [place], [phone] [facility]: [address], [phone]  [facility] Doctors  [facility]): [phone] [facility]: [address] [phone]   [facility] Doctors [facility]: [address], [phone]  [facility] Family Doctors  [facility]: [phone], [phone]  Home Blood Pressure Monitoring for Patients   Your provider has recommended that you check your  blood pressure (BP) at least once a week at home. If you do not have a blood pressure cuff at home, one will be provided for you. Contact your provider if you have not received your monitor within 1 week.   Helpful Tips for Accurate Home Blood Pressure Checks  Don't smoke, exercise, or drink caffeine 30 minutes before checking your BP Use the restroom before checking your BP (a full bladder can raise your  pressure) Relax in a comfortable upright chair Feet on the ground Left arm resting comfortably on a flat surface at the level of your heart Legs uncrossed Back supported Sit quietly and don't talk Place the cuff on your bare arm Adjust snuggly, so that only two fingertips can fit between your skin and the top of the cuff Check 2 readings separated by at least one minute Keep a log of your BP readings For a visual, please reference this diagram: http://ccnc.care/bpdiagram  Provider Name: [facility]     Phone: [phone]  Zone 1: ALL CLEAR  Continue to monitor your symptoms:  BP reading is less than 140 (top number) or less than 90 (bottom number)  No right upper stomach pain No headaches or seeing spots No feeling nauseated or throwing up No swelling in face and hands  Zone 2: CAUTION Call your doctor's office for any of the following:  BP reading is greater than 140 (top number) or greater than 90 (bottom number)  Stomach pain under your ribs in the middle or right side Headaches or seeing spots Feeling nauseated or throwing up Swelling in face and hands  Zone 3: EMERGENCY  Seek immediate medical care if you have any of the following:  BP reading is greater than160 (top number) or greater than 110 (bottom number) Severe headaches not improving with Tylenol Serious difficulty catching your breath Any worsening symptoms from Zone 2   Third Trimester of Pregnancy The third trimester is from week 29 through week 42, months 7 through 9. The third trimester is a time when the fetus is growing rapidly. At the end of the ninth month, the fetus is about 20 inches in length and weighs 6-10 pounds.  BODY CHANGES Your body goes through many changes during pregnancy. The changes vary from woman to woman.  Your weight will continue to increase. You can expect to gain 25-35 pounds (11-16 kg) by the end of the pregnancy. You may begin to get stretch marks on your hips, abdomen,  and breasts. You may urinate more often because the fetus is moving lower into your pelvis and pressing on your bladder. You may develop or continue to have heartburn as a result of your pregnancy. You may develop constipation because certain hormones are causing the muscles that push waste through your intestines to slow down. You may develop hemorrhoids or swollen, bulging veins (varicose veins). You may have pelvic pain because of the weight gain and pregnancy hormones relaxing your joints between the bones in your pelvis. Backaches may result from overexertion of the muscles supporting your posture. You may have changes in your hair. These can include thickening of your hair, rapid growth, and changes in texture. Some women also have hair loss during or after pregnancy, or hair that feels dry or thin. Your hair will most likely return to normal after your baby is born. Your breasts will continue to grow and be tender. A yellow discharge may leak from your breasts called colostrum. Your belly button may stick out. You may   feel short of breath because of your expanding uterus. You may notice the fetus "dropping," or moving lower in your abdomen. You may have a bloody mucus discharge. This usually occurs a few days to a week before labor begins. Your cervix becomes thin and soft (effaced) near your due date. WHAT TO EXPECT AT YOUR PRENATAL EXAMS  You will have prenatal exams every 2 weeks until week 36. Then, you will have weekly prenatal exams. During a routine prenatal visit: You will be weighed to make sure you and the fetus are growing normally. Your blood pressure is taken. Your abdomen will be measured to track your baby's growth. The fetal heartbeat will be listened to. Any test results from the previous visit will be discussed. You may have a cervical check near your due date to see if you have effaced. At around 36 weeks, your caregiver will check your cervix. At the same time, your  caregiver will also perform a test on the secretions of the vaginal tissue. This test is to determine if a type of bacteria, Group B streptococcus, is present. Your caregiver will explain this further. Your caregiver may ask you: What your birth plan is. How you are feeling. If you are feeling the baby move. If you have had any abnormal symptoms, such as leaking fluid, bleeding, severe headaches, or abdominal cramping. If you have any questions. Other tests or screenings that may be performed during your third trimester include: Blood tests that check for low iron levels (anemia). Fetal testing to check the health, activity level, and growth of the fetus. Testing is done if you have certain medical conditions or if there are problems during the pregnancy. FALSE LABOR You may feel small, irregular contractions that eventually go away. These are called Braxton Hicks contractions, or false labor. Contractions may last for hours, days, or even weeks before true labor sets in. If contractions come at regular intervals, intensify, or become painful, it is best to be seen by your caregiver.  SIGNS OF LABOR  Menstrual-like cramps. Contractions that are 5 minutes apart or less. Contractions that start on the top of the uterus and spread down to the lower abdomen and back. A sense of increased pelvic pressure or back pain. A watery or bloody mucus discharge that comes from the vagina. If you have any of these signs before the 37th week of pregnancy, call your caregiver right away. You need to go to the hospital to get checked immediately. HOME CARE INSTRUCTIONS  Avoid all smoking, herbs, alcohol, and unprescribed drugs. These chemicals affect the formation and growth of the baby. Follow your caregiver's instructions regarding medicine use. There are medicines that are either safe or unsafe to take during pregnancy. Exercise only as directed by your caregiver. Experiencing uterine cramps is a good sign to  stop exercising. Continue to eat regular, healthy meals. Wear a good support bra for breast tenderness. Do not use hot tubs, steam rooms, or saunas. Wear your seat belt at all times when driving. Avoid raw meat, uncooked cheese, cat litter boxes, and soil used by cats. These carry germs that can cause birth defects in the baby. Take your prenatal vitamins. Try taking a stool softener (if your caregiver approves) if you develop constipation. Eat more high-fiber foods, such as fresh vegetables or fruit and whole grains. Drink plenty of fluids to keep your urine clear or pale yellow. Take warm sitz baths to soothe any pain or discomfort caused by hemorrhoids. Use hemorrhoid cream if   your caregiver approves. If you develop varicose veins, wear support hose. Elevate your feet for 15 minutes, 3-4 times a day. Limit salt in your diet. Avoid heavy lifting, wear low heal shoes, and practice good posture. Rest a lot with your legs elevated if you have leg cramps or low back pain. Visit your dentist if you have not gone during your pregnancy. Use a soft toothbrush to brush your teeth and be gentle when you floss. A sexual relationship may be continued unless your caregiver directs you otherwise. Do not travel far distances unless it is absolutely necessary and only with the approval of your caregiver. Take prenatal classes to understand, practice, and ask questions about the labor and delivery. Make a trial run to the hospital. Pack your hospital bag. Prepare the baby's nursery. Continue to go to all your prenatal visits as directed by your caregiver. SEEK MEDICAL CARE IF: You are unsure if you are in labor or if your water has broken. You have dizziness. You have mild pelvic cramps, pelvic pressure, or nagging pain in your abdominal area. You have persistent nausea, vomiting, or diarrhea. You have a bad smelling vaginal discharge. You have pain with urination. SEEK IMMEDIATE MEDICAL CARE IF:  You  have a fever. You are leaking fluid from your vagina. You have spotting or bleeding from your vagina. You have severe abdominal cramping or pain. You have rapid weight loss or gain. You have shortness of breath with chest pain. You notice sudden or extreme swelling of your face, hands, ankles, feet, or legs. You have not felt your baby move in over an hour. You have severe headaches that do not go away with medicine. You have vision changes. Document Released: 05/17/2001 Document Revised: 05/28/2013 Document Reviewed: 07/24/2012 [facility] Patient Information 2015 [place], [place]. This information is not intended to replace advice given to you by your health care provider. Make sure you discuss any questions you have with your health care provider.

## 2021-05-12 NOTE — Progress Notes (Signed)
   LOW-RISK PREGNANCY VISIT Patient name: Lindsay Bryant MRN [id number]  Date of birth: 11-28-2000 Chief Complaint:   Routine Prenatal Visit  History of Present Illness:   Lindsay Bryant is a 20 y.o. G17P1001 female at [redacted]w[redacted]d with an Estimated Date of Delivery: 07/09/21 being seen today for ongoing management of a low-risk pregnancy.  Today she reports no complaints. Contractions: Not present. Vag. Bleeding: None.  Movement: Present. denies leaking of fluid. Review of Systems:   Pertinent items are noted in HPI Denies abnormal vaginal discharge w/ itching/odor/irritation, headaches, visual changes, shortness of breath, chest pain, abdominal pain, severe nausea/vomiting, or problems with urination or bowel movements unless otherwise stated above. Pertinent History Reviewed:  Reviewed past medical,surgical, social, obstetrical and family history.  Reviewed problem list, medications and allergies. Physical Assessment:   Vitals:   05/12/21 1006  BP: 115/74  Pulse: 92  Weight: 163 lb (73.9 kg)  Body mass index is 25.53 kg/m.        Physical Examination:   General appearance: Well appearing, and in no distress  Mental status: Alert, oriented to person, place, and time  Skin: Warm & dry  Cardiovascular: Normal heart rate noted  Respiratory: Normal respiratory effort, no distress  Abdomen: Soft, gravid, nontender  Pelvic: Cervical exam deferred         Extremities: Edema: None  Fetal Status: Fetal Heart Rate (bpm): 138 Fundal Height: 31 cm Movement: Present    No results found for this or any previous visit (from the past 24 hour(s)).  Assessment & Plan:  1) Low-risk pregnancy G2P1001 at [redacted]w[redacted]d with an Estimated Date of Delivery: 07/09/21   2) Rh neg, got Rhogam today   Meds: No orders of the defined types were placed in this encounter.  Labs/procedures today: Rhogam  Plan:  Continue routine obstetrical care   Reviewed: Preterm labor symptoms and general obstetric precautions including  but not limited to vaginal bleeding, contractions, leaking of fluid and fetal movement were reviewed in detail with the patient.  All questions were answered. Has home bp cuff. Check bp weekly, let us know if >140/90.   Follow-up: Return in about 2 weeks (around 05/26/2021) for LROB, in person.  Orders Placed This Encounter  Procedures   RHO (D) Immune Globulin   Arabella Merles [facility] 05/12/2021 10:23 AM

## 2021-05-26 ENCOUNTER — Ambulatory Visit (INDEPENDENT_AMBULATORY_CARE_PROVIDER_SITE_OTHER): Payer: Medicaid Other | Admitting: Obstetrics & Gynecology

## 2021-05-26 ENCOUNTER — Encounter: Payer: Self-pay | Admitting: Obstetrics & Gynecology

## 2021-05-26 ENCOUNTER — Other Ambulatory Visit: Payer: Self-pay

## 2021-05-26 VITALS — BP 122/70 | HR 99 | Wt 163.4 lb

## 2021-05-26 DIAGNOSIS — Z3483 Encounter for supervision of other normal pregnancy, third trimester: Secondary | ICD-10-CM

## 2021-05-26 NOTE — Progress Notes (Signed)
° °  LOW-RISK PREGNANCY VISIT Patient name: Lindsay Bryant MRN [id number]  Date of birth: [birth year] Chief Complaint:   Routine Prenatal Visit  History of Present Illness:   Lindsay Bryant is a 20 y.o. G55P1001 female at [redacted]w[redacted]d with an Estimated Date of Delivery: 07/09/21 being seen today for ongoing management of a low-risk pregnancy.  Depression screen [facility] 2/9 04/14/2021 12/28/2020 04/03/2019  Decreased Interest 3 3 0  Down, Depressed, Hopeless 2 3 0  PHQ - 2 Score 5 6 0  Altered sleeping 3 2 3   Tired, decreased energy 3 3 3   Change in appetite 0 2 0  Feeling bad or failure about yourself  2 3 0  Trouble concentrating 1 0 0  Moving slowly or fidgety/restless 0 0 0  Suicidal thoughts 1 1 0  PHQ-9 Score 15 17 6     Today she reports no complaints. Contractions: Irritability. Vag. Bleeding: None.  Movement: Present. denies leaking of fluid. Review of Systems:   Pertinent items are noted in HPI Denies abnormal vaginal discharge w/ itching/odor/irritation, headaches, visual changes, shortness of breath, chest pain, abdominal pain, severe nausea/vomiting, or problems with urination or bowel movements unless otherwise stated above. Pertinent History Reviewed:  Reviewed past medical,surgical, social, obstetrical and family history.  Reviewed problem list, medications and allergies.  Physical Assessment:   Vitals:   05/26/21 1519  BP: 122/70  Pulse: 99  Weight: 163 lb 6.4 oz (74.1 kg)  Body mass index is 25.59 kg/m.        Physical Examination:   General appearance: Well appearing, and in no distress  Mental status: Alert, oriented to person, place, and time  Skin: Warm & dry  Respiratory: Normal respiratory effort, no distress  Abdomen: Soft, gravid, nontender  Pelvic: Cervical exam deferred         Extremities: Edema: None  Psych:  mood and affect appropriate  Fetal Status: Fetal Heart Rate (bpm): 135 Fundal Height: 33 cm Movement: Present    Chaperone: n/a    No results found for  this or any previous visit (from the past 24 hour(s)).   Assessment & Plan:  1) Low-risk pregnancy G2P1001 at 102w5d with an Estimated Date of Delivery: 07/09/21     Meds: No orders of the defined types were placed in this encounter.  Labs/procedures today: doppler  Plan:  Continue routine obstetrical care  Next visit: prefers in person    Reviewed: Preterm labor symptoms and general obstetric precautions including but not limited to vaginal bleeding, contractions, leaking of fluid and fetal movement were reviewed in detail with the patient.  All questions were answered. Pt has home bp cuff. Check bp weekly, let 05/28/21 know if >140/90.   Follow-up: Return in about 2 weeks (around 06/09/2021) for LROB visit.  No orders of the defined types were placed in this encounter.   09/06/21, DO Attending Obstetrician & Gynecologist, [facility] for 08/07/2021, [facility]

## 2021-06-06 NOTE — L&D Delivery Note (Signed)
LABOR COURSE   Delivery Note Called to room and patient was complete and pushing. Head delivered 0735. No nuchal cord present. Shoulder and body delivered in usual fashion. At 0736 a viable female was delivered via Vaginal, Spontaneous (Presentation: ROA).  Infant with spontaneous cry, placed on mother's abdomen, dried and stimulated. Cord clamped x 2 after 1-minute delay, and cut by father. Cord blood drawn. Placenta delivered spontaneously with gentle cord traction. Appears intact. Fundus firm with massage and Pitocin. Labia, perineum, vagina, and cervix inspected with 4x4, intact.    APGAR: 8,9 ; weight: pending see delivery summary    Cord: 3VC with the following complications:None.     Anesthesia: Epidural  Episiotomy: None Lacerations: None Suture Repair:  N/A Est. Blood Loss (mL): 150  Mom to postpartum.  Baby girl to Couplet care / Skin to Skin.  Raelyn Number, SNM 7:49 AM

## 2021-06-15 ENCOUNTER — Ambulatory Visit (INDEPENDENT_AMBULATORY_CARE_PROVIDER_SITE_OTHER): Payer: Medicaid Other | Admitting: Women's Health

## 2021-06-15 ENCOUNTER — Other Ambulatory Visit (HOSPITAL_COMMUNITY)
Admission: RE | Admit: 2021-06-15 | Discharge: 2021-06-15 | Disposition: A | Discharge status: Home / Self Care | Payer: Medicaid Other | Source: Ambulatory Visit | Attending: Women's Health | Admitting: Women's Health

## 2021-06-15 ENCOUNTER — Other Ambulatory Visit: Payer: Self-pay

## 2021-06-15 ENCOUNTER — Encounter: Payer: Self-pay | Admitting: Women's Health

## 2021-06-15 VITALS — BP 113/66 | HR 92 | Wt 167.0 lb

## 2021-06-15 DIAGNOSIS — Z3493 Encounter for supervision of normal pregnancy, unspecified, third trimester: Secondary | ICD-10-CM | POA: Diagnosis not present

## 2021-06-15 DIAGNOSIS — Z1389 Encounter for screening for other disorder: Secondary | ICD-10-CM

## 2021-06-15 DIAGNOSIS — Z3A36 36 weeks gestation of pregnancy: Secondary | ICD-10-CM

## 2021-06-15 DIAGNOSIS — Z3483 Encounter for supervision of other normal pregnancy, third trimester: Secondary | ICD-10-CM

## 2021-06-15 DIAGNOSIS — Z3480 Encounter for supervision of other normal pregnancy, unspecified trimester: Secondary | ICD-10-CM

## 2021-06-15 LAB — OB RESULTS CONSOLE GC/CHLAMYDIA: Gonorrhea: NEGATIVE

## 2021-06-15 NOTE — Patient Instructions (Signed)
Jameica, thank you for choosing our office today! We appreciate the opportunity to meet your healthcare needs. You may receive a short survey by mail, e-mail, or through [employer]. If you are happy with your care we would appreciate if you could take just a few minutes to complete the survey questions. We read all of your comments and take your feedback very seriously. Thank you again for choosing our office.  Center for [employer] Team at [place] at [facility] ([place], [place] [address]) Entrance C, located off of [address] parking   CLASSES: Go to [email or web address] to register for classes (childbirth, breastfeeding, waterbirth, infant CPR, daddy bootcamp, etc.)  Call the office [phone]) or go to [facility] if: You begin to have strong, frequent contractions Your water breaks.  Sometimes it is a big gush of fluid, sometimes it is just a trickle that keeps getting your panties wet or running down your legs You have vaginal bleeding.  It is normal to have a small amount of spotting if your cervix was checked.  You don't feel your baby moving like normal.  If you don't, get you something to eat and drink and lay down and focus on feeling your baby move.   If your baby is still not moving like normal, you should call the office or go to [facility].  Call the office [phone]) or go to [facility] hospital for these signs of pre-eclampsia: Severe headache that does not go away with Tylenol Visual changes- seeing spots, double, blurred vision Pain under your right breast or upper abdomen that does not go away with Tums or heartburn medicine Nausea and/or vomiting Severe swelling in your hands, feet, and face   [facility] Pediatricians/Family Doctors [facility]): [address] [place], [place]: [address] [place], [place] [facility]): [place], [phone] (call to ask if accepting patients) [facility] Department: [address], [place], [facility]): [address], [place] [facility]: [address] [place], [place] [facility]: [place], [facility]): [phone] [facility]: [address] [place]: [address], [place] [facility]: [phone], [phone]  Home Blood Pressure Monitoring for Patients   Your provider has recommended that you check your blood pressure (BP) at least once a week at home. If you do not have a blood pressure cuff at home, one will be provided for you. Contact your provider if you have not received your monitor within 1 week.   Helpful Tips for Accurate Home Blood Pressure Checks  Don't smoke, exercise, or drink caffeine 30 minutes before checking your BP Use the restroom before checking your BP (a full bladder can raise your pressure) Relax in a comfortable upright chair Feet on the ground Left arm resting comfortably on a flat surface at the level of your heart Legs uncrossed Back supported Sit quietly and don't talk Place the cuff on your bare arm Adjust snuggly, so that only two fingertips  can fit between your skin and the top of the cuff Check 2 readings separated by at least one minute Keep a log of your BP readings For a visual, please reference this diagram: http://ccnc.care/bpdiagram  Provider Name: [facility]     Phone: [phone]  Zone 1: ALL CLEAR  Continue to monitor your symptoms:  BP reading is less than 140 (top number) or less than 90 (bottom number)  No right  upper stomach pain No headaches or seeing spots No feeling nauseated or throwing up No swelling in face and hands  Zone 2: CAUTION Call your doctor's office for any of the following:  BP reading is greater than 140 (top number) or greater than 90 (bottom number)  Stomach pain under your ribs in the middle or right side Headaches or seeing spots Feeling nauseated or throwing up Swelling in face and hands  Zone 3: EMERGENCY  Seek immediate medical care if you have any of the following:  BP reading is greater than160 (top number) or greater than 110 (bottom number) Severe headaches not improving with Tylenol Serious difficulty catching your breath Any worsening symptoms from Zone 2   Braxton Hicks Contractions Contractions of the uterus can occur throughout pregnancy, but they are not always a sign that you are in labor. You may have practice contractions called Braxton Hicks contractions. These false labor contractions are sometimes confused with true labor. What are [place] contractions? Braxton Hicks contractions are tightening movements that occur in the muscles of the uterus before labor. Unlike true labor contractions, these contractions do not result in opening (dilation) and thinning of the cervix. Toward the end of pregnancy (32-34 weeks), Braxton Hicks contractions can happen more often and may become stronger. These contractions are sometimes difficult to tell apart from true labor because they can be very uncomfortable. You should not feel embarrassed if you go to the hospital with false labor. Sometimes, the only way to tell if you are in true labor is for your health care provider to look for changes in the cervix. The health care provider will do a physical exam and may monitor your contractions. If you are not in true labor, the exam should show that your cervix is not dilating and your water has not broken. If there are no other health problems associated with your  pregnancy, it is completely safe for you to be sent home with false labor. You may continue to have Braxton Hicks contractions until you go into true labor. How to tell the difference between true labor and false labor True labor Contractions last 30-70 seconds. Contractions become very regular. Discomfort is usually felt in the top of the uterus, and it spreads to the lower abdomen and low back. Contractions do not go away with walking. Contractions usually become more intense and increase in frequency. The cervix dilates and gets thinner. False labor Contractions are usually shorter and not as strong as true labor contractions. Contractions are usually irregular. Contractions are often felt in the front of the lower abdomen and in the groin. Contractions may go away when you walk around or change positions while lying down. Contractions get weaker and are shorter-lasting as time goes on. The cervix usually does not dilate or become thin. Follow these instructions at home:  Take over-the-counter and prescription medicines only as told by your health care provider. Keep up with your usual exercises and follow other instructions from your health care provider. Eat and drink lightly if you think  you are going into labor. If Braxton Hicks contractions are making you uncomfortable: Change your position from lying down or resting to walking, or change from walking to resting. Sit and rest in a tub of warm water. Drink enough fluid to keep your urine pale yellow. Dehydration may cause these contractions. Do slow and deep breathing several times an hour. Keep all follow-up prenatal visits as told by your health care provider. This is important. Contact a health care provider if: You have a fever. You have continuous pain in your abdomen. Get help right away if: Your contractions become stronger, more regular, and closer together. You have fluid leaking or gushing from your vagina. You pass  blood-tinged mucus (bloody show). You have bleeding from your vagina. You have low back pain that you never had before. You feel your babys head pushing down and causing pelvic pressure. Your baby is not moving inside you as much as it used to. Summary Contractions that occur before labor are called Braxton Hicks contractions, false labor, or practice contractions. Braxton Hicks contractions are usually shorter, weaker, farther apart, and less regular than true labor contractions. True labor contractions usually become progressively stronger and regular, and they become more frequent. Manage discomfort from [facility] contractions by changing position, resting in a warm bath, drinking plenty of water, or practicing deep breathing. This information is not intended to replace advice given to you by your health care provider. Make sure you discuss any questions you have with your health care provider. Document Revised: 05/05/2017 Document Reviewed: 10/06/2016 Elsevier Patient Education  [place].

## 2021-06-15 NOTE — Progress Notes (Signed)
LOW-RISK PREGNANCY VISIT Patient name: Peightyn Rackow MRN [id number]  Date of birth: 2001-02-15 Chief Complaint:   Routine Prenatal Visit (Cultures today)  History of Present Illness:   Shailah Wiederkehr is a 21 y.o. G33P1001 female at [redacted]w[redacted]d with an Estimated Date of Delivery: 07/09/21 being seen today for ongoing management of a low-risk pregnancy.   Today she reports  had bad contraction the other morning, ever since top of abdomen has had burning sensation, hurts w/ certain movements . Contractions: Irregular.  .  Movement: Present. denies leaking of fluid.  Depression screen [facility] 2/9 04/14/2021 12/28/2020 04/03/2019  Decreased Interest 3 3 0  Down, Depressed, Hopeless 2 3 0  PHQ - 2 Score 5 6 0  Altered sleeping 3 2 3   Tired, decreased energy 3 3 3   Change in appetite 0 2 0  Feeling bad or failure about yourself  2 3 0  Trouble concentrating 1 0 0  Moving slowly or fidgety/restless 0 0 0  Suicidal thoughts 1 1 0  PHQ-9 Score 15 17 6      GAD 7 : Generalized Anxiety Score 04/14/2021 12/28/2020  Nervous, Anxious, on Edge 2 2  Control/stop worrying 2 2  Worry too much - different things 3 3  Trouble relaxing 1 2  Restless 0 0  Easily annoyed or irritable 2 3  Afraid - awful might happen 1 1  Total GAD 7 Score 11 13      Review of Systems:   Pertinent items are noted in HPI Denies abnormal vaginal discharge w/ itching/odor/irritation, headaches, visual changes, shortness of breath, chest pain, abdominal pain, severe nausea/vomiting, or problems with urination or bowel movements unless otherwise stated above. Pertinent History Reviewed:  Reviewed past medical,surgical, social, obstetrical and family history.  Reviewed problem list, medications and allergies. Physical Assessment:   Vitals:   06/15/21 1532  BP: 113/66  Pulse: 92  Weight: 167 lb (75.8 kg)  Body mass index is 26.16 kg/m.        Physical Examination:   General appearance: Well appearing, and in no distress  Mental  status: Alert, oriented to person, place, and time  Skin: Warm & dry  Cardiovascular: Normal heart rate noted  Respiratory: Normal respiratory effort, no distress  Abdomen: Soft, gravid, nontender  Pelvic:  SVE done   Dilation: 1 Effacement (%): Thick Station: -2  Extremities: Edema: None  Fetal Status: Fetal Heart Rate (bpm): 140 Fundal Height: 34 cm Movement: Present Presentation: Vertex  Chaperone: Angel Neas   No results found for this or any previous visit (from the past 24 hour(s)).  Assessment & Plan:  1) Low-risk pregnancy G2P1001 at [redacted]w[redacted]d with an Estimated Date of Delivery: 07/09/21   2) Burning pain upper abd, not like reflux, more muscular, possibly diastasis recti   Meds: No orders of the defined types were placed in this encounter.  Labs/procedures today: GBS, GC/CT, and SVE  Plan:  Continue routine obstetrical care  Next visit: prefers in person    Reviewed: Preterm labor symptoms and general obstetric precautions including but not limited to vaginal bleeding, contractions, leaking of fluid and fetal movement were reviewed in detail with the patient.  All questions were answered. Does have home bp cuff. Office bp cuff given: not applicable. Check bp weekly, let us know if consistently >140 and/or >90.  Follow-up: Return for weekly, LROB, CNM, in person.  Future Appointments  Date Time Provider [place]  06/22/2021  8:50 AM Eure, Mertie Clause, MD [facility]  07/02/2021  8:30 AM Janyth Pupa, DO [facility]  07/06/2021  8:30 AM Roma Schanz, CNM [facility]    Orders Placed This Encounter  Procedures   Culture, beta strep (group b only)   Roma Schanz CNM, [facility] 06/15/2021 4:26 PM

## 2021-06-17 LAB — CERVICOVAGINAL ANCILLARY ONLY
Chlamydia: NEGATIVE
Comment: NEGATIVE
Comment: NORMAL
Neisseria Gonorrhea: NEGATIVE

## 2021-06-19 LAB — CULTURE, BETA STREP (GROUP B ONLY): Strep Gp B Culture: NEGATIVE

## 2021-06-22 ENCOUNTER — Ambulatory Visit (INDEPENDENT_AMBULATORY_CARE_PROVIDER_SITE_OTHER): Payer: Medicaid Other | Admitting: Obstetrics & Gynecology

## 2021-06-22 ENCOUNTER — Other Ambulatory Visit: Payer: Self-pay

## 2021-06-22 ENCOUNTER — Encounter: Payer: Self-pay | Admitting: Obstetrics & Gynecology

## 2021-06-22 VITALS — BP 127/73 | HR 98 | Wt 171.0 lb

## 2021-06-22 DIAGNOSIS — Z3480 Encounter for supervision of other normal pregnancy, unspecified trimester: Secondary | ICD-10-CM

## 2021-06-22 NOTE — Progress Notes (Signed)
° °  LOW-RISK PREGNANCY VISIT Patient name: Lindsay Bryant MRN [id number]  Date of birth: 2001/05/04 Chief Complaint:   Routine Prenatal Visit  History of Present Illness:   Lindsay Bryant is a 21 y.o. G43P1001 female at [redacted]w[redacted]d with an Estimated Date of Delivery: 07/09/21 being seen today for ongoing management of a low-risk pregnancy.  Depression screen [facility] 2/9 04/14/2021 12/28/2020 04/03/2019  Decreased Interest 3 3 0  Down, Depressed, Hopeless 2 3 0  PHQ - 2 Score 5 6 0  Altered sleeping 3 2 3   Tired, decreased energy 3 3 3   Change in appetite 0 2 0  Feeling bad or failure about yourself  2 3 0  Trouble concentrating 1 0 0  Moving slowly or fidgety/restless 0 0 0  Suicidal thoughts 1 1 0  PHQ-9 Score 15 17 6     Today she reports no complaints. Contractions: Not present. Vag. Bleeding: None.  Movement: Present. denies leaking of fluid. Review of Systems:   Pertinent items are noted in HPI Denies abnormal vaginal discharge w/ itching/odor/irritation, headaches, visual changes, shortness of breath, chest pain, abdominal pain, severe nausea/vomiting, or problems with urination or bowel movements unless otherwise stated above. Pertinent History Reviewed:  Reviewed past medical,surgical, social, obstetrical and family history.  Reviewed problem list, medications and allergies. Physical Assessment:   Vitals:   06/22/21 0906  BP: 127/73  Pulse: 98  Weight: 171 lb (77.6 kg)  Body mass index is 26.78 kg/m.        Physical Examination:   General appearance: Well appearing, and in no distress  Mental status: Alert, oriented to person, place, and time  Skin: Warm & dry  Cardiovascular: Normal heart rate noted  Respiratory: Normal respiratory effort, no distress  Abdomen: Soft, gravid, nontender  Pelvic: Cervical exam performed  Dilation: Closed Effacement (%): Thick Station: -2  Extremities: Edema: None  Fetal Status: Fetal Heart Rate (bpm): 137 Fundal Height: 37 cm Movement: Present  Presentation: Vertex  Chaperone: Therapist, sports    No results found for this or any previous visit (from the past 24 hour(s)).  Assessment & Plan:  1) Low-risk pregnancy G2P1001 at [redacted]w[redacted]d with an Estimated Date of Delivery: 07/09/21      Meds: No orders of the defined types were placed in this encounter.  Labs/procedures today:   Plan:  Continue routine obstetrical care  Next visit: prefers in person    Reviewed: Term labor symptoms and general obstetric precautions including but not limited to vaginal bleeding, contractions, leaking of fluid and fetal movement were reviewed in detail with the patient.  All questions were answered. Has home bp cuff. Rx faxed to . Check bp weekly, let us know if >140/90.   Follow-up: Return in about 1 week (around 06/29/2021) for Junior.  No orders of the defined types were placed in this encounter.   Florian Buff, MD 06/22/2021 9:40 AM

## 2021-06-26 ENCOUNTER — Inpatient Hospital Stay (HOSPITAL_COMMUNITY)
Admission: AD | Admit: 2021-06-26 | Discharge: 2021-06-26 | Disposition: A | Discharge status: Home / Self Care | Payer: Medicaid Other | Attending: Family Medicine | Admitting: Family Medicine

## 2021-06-26 ENCOUNTER — Other Ambulatory Visit: Payer: Self-pay

## 2021-06-26 ENCOUNTER — Encounter (HOSPITAL_COMMUNITY): Payer: Self-pay | Admitting: Family Medicine

## 2021-06-26 DIAGNOSIS — O471 False labor at or after 37 completed weeks of gestation: Secondary | ICD-10-CM | POA: Insufficient documentation

## 2021-06-26 DIAGNOSIS — Z3A38 38 weeks gestation of pregnancy: Secondary | ICD-10-CM | POA: Diagnosis not present

## 2021-06-26 DIAGNOSIS — O26899 Other specified pregnancy related conditions, unspecified trimester: Secondary | ICD-10-CM

## 2021-06-26 DIAGNOSIS — Z3480 Encounter for supervision of other normal pregnancy, unspecified trimester: Secondary | ICD-10-CM

## 2021-06-26 DIAGNOSIS — Z3689 Encounter for other specified antenatal screening: Secondary | ICD-10-CM | POA: Insufficient documentation

## 2021-06-26 MED ORDER — ZOLPIDEM TARTRATE 5 MG PO TABS
5.0000 mg | ORAL_TABLET | Freq: Once | ORAL | Status: AC
  Administered 2021-06-26: 5 mg via ORAL
  Filled 2021-06-26: qty 1

## 2021-06-26 MED ORDER — MORPHINE SULFATE (PF) 4 MG/ML IV SOLN
4.0000 mg | Freq: Once | INTRAVENOUS | Status: AC
  Administered 2021-06-26: 4 mg via INTRAMUSCULAR
  Filled 2021-06-26: qty 1

## 2021-06-26 NOTE — MAU Provider Note (Addendum)
Event Date/Time   First Provider Initiated Contact with Patient 06/26/21 1844       S: [name] is a 21 y.o. G2P1001 at 105w1d  who presents to MAU today complaining painful contractions for the last 2 hrs. She denies vaginal bleeding. She denies LOF. She reports less fetal movement since the ctx started.  O: BP 111/74 (BP Location: Right Arm)    Pulse 98    Temp 98.4 F (36.9 C) (Oral)    Resp 17    LMP 10/02/2020 (Exact Date)    SpO2 100%  GENERAL: Well-developed, well-nourished female in no acute distress.  HEAD: Normocephalic, atraumatic.  CHEST: Normal effort of breathing, regular heart rate ABDOMEN: Soft, nontender, gravid  Cervical exam:  Dilation: 2 Effacement (%): 50 Cervical Position: Posterior Station: -3 Presentation: Vertex Exam by:: c soliz rn   Fetal Monitoring: Baseline: 135 Variability: mod Accelerations: + Decelerations: no Contractions: 1.5-4  MDM: feeling more FM since EFM applied, NST reactive. Plan for recheck in 1-2 hrs. Transfer of care given to [place], [place]  06/26/2021 7:57 PM   A:   P:  Reassessment (8:34 PM)  -No cervical change. -Patient offered and accepts pain medication. -Will give Morphine and Ambien prior to discharge. -Labor and return precautions given.  -Discharged to home in stable condition.  Cherre Robins MSN, CNM Advanced Practice Provider, Center for [employer]

## 2021-06-26 NOTE — MAU Note (Addendum)
...  Lindsay Bryant is a 21 y.o. at [redacted]w[redacted]d here in MAU reporting: CTX that began one and a half hours ago while shopping. She states she has had decreased fetal movement since then as well. Felt one movement in MAU triage. Also endorsing a HA that began around 10 minutes ago. No VB or LOF.   Pain score: 6/10 lower abdomen 4/10 HA  FHT: 135 doppler

## 2021-06-29 ENCOUNTER — Inpatient Hospital Stay (HOSPITAL_COMMUNITY)
Admission: AD | Admit: 2021-06-29 | Discharge: 2021-06-29 | Disposition: A | Discharge status: Home / Self Care | Payer: Medicaid Other | Attending: Obstetrics and Gynecology | Admitting: Obstetrics and Gynecology

## 2021-06-29 ENCOUNTER — Other Ambulatory Visit: Payer: Self-pay

## 2021-06-29 ENCOUNTER — Encounter (HOSPITAL_COMMUNITY): Payer: Self-pay | Admitting: Obstetrics and Gynecology

## 2021-06-29 DIAGNOSIS — O365930 Maternal care for other known or suspected poor fetal growth, third trimester, not applicable or unspecified: Secondary | ICD-10-CM | POA: Diagnosis present

## 2021-06-29 DIAGNOSIS — O471 False labor at or after 37 completed weeks of gestation: Secondary | ICD-10-CM | POA: Diagnosis not present

## 2021-06-29 DIAGNOSIS — N898 Other specified noninflammatory disorders of vagina: Secondary | ICD-10-CM | POA: Diagnosis not present

## 2021-06-29 DIAGNOSIS — Z3A38 38 weeks gestation of pregnancy: Secondary | ICD-10-CM | POA: Insufficient documentation

## 2021-06-29 DIAGNOSIS — O26893 Other specified pregnancy related conditions, third trimester: Secondary | ICD-10-CM | POA: Insufficient documentation

## 2021-06-29 DIAGNOSIS — O479 False labor, unspecified: Secondary | ICD-10-CM | POA: Diagnosis not present

## 2021-06-29 DIAGNOSIS — O26899 Other specified pregnancy related conditions, unspecified trimester: Secondary | ICD-10-CM

## 2021-06-29 LAB — POCT FERN TEST: POCT Fern Test: NEGATIVE

## 2021-06-29 NOTE — MAU Note (Signed)
Leaking small amts clear liquid since 1530. Started having contractions on way to hospital. Was 2cm on Sat. Good Fm

## 2021-06-29 NOTE — MAU Provider Note (Signed)
Chief Complaint:  Rupture of Membranes   Event Date/Time   First Provider Initiated Contact with Patient 06/29/21 2034     HPI: Lindsay Bryant is a 21 y.o. G2P1001 at 34w4dwho presents to maternity admissions reporting fluid leaking and contractions.  Has been 2cm on last exam. . She reports good fetal movement, denies vaginal bleeding, urinary symptoms, h/a, dizziness, n/v, diarrhea, constipation or fever/chills. .  Vaginal Discharge The patient's primary symptoms include pelvic pain and vaginal discharge. The patient's pertinent negatives include no genital itching, genital lesions, genital odor or vaginal bleeding. This is a new problem. The current episode started today. The pain is mild. The problem affects both sides. She is pregnant. Associated symptoms include abdominal pain. Pertinent negatives include no constipation, diarrhea, dysuria, fever, nausea or vomiting. The vaginal discharge was clear and watery. There has been no bleeding. She has not been passing clots. She has not been passing tissue. Nothing aggravates the symptoms. She has tried nothing for the symptoms.   RN Note:' Leaking small amts clear liquid since 1530. Started having contractions on way to hospital. Was 2cm on Sat. Good Fm  Past Medical History: Past Medical History:  Diagnosis Date   Anemia     Past obstetric history: OB History  Gravida Para Term Preterm AB Living  2 1 1     1   SAB IAB Ectopic Multiple Live Births        0 1    # Outcome Date GA Lbr Len/2nd Weight Sex Delivery Anes PTL Lv  2 Current           1 Term 06/14/19 [redacted]w[redacted]d 08:22 / 04:16 4386 g M Vag-Spont EPI N LIV    Past Surgical History: Past Surgical History:  Procedure Laterality Date   TONSILLECTOMY      Family History: Family History  Adopted: Yes    Social History: Social History   Tobacco Use   Smoking status: Never   Smokeless tobacco: Never  Vaping Use   Vaping Use: Never used  Substance Use Topics   Alcohol use:  Never   Drug use: Never    Allergies: No Known Allergies  Meds:  Medications Prior to Admission  Medication Sig Dispense Refill Last Dose   Prenatal Vit-Fe Fumarate-FA (PRENATAL VITAMIN PO) Take by mouth.      promethazine (PHENERGAN) 25 MG tablet Take 1 tablet (25 mg total) by mouth every 6 (six) hours as needed for nausea or vomiting. 30 tablet 1     I have reviewed patient's Past Medical Hx, Surgical Hx, Family Hx, Social Hx, medications and allergies.   ROS:  Review of Systems  Constitutional:  Negative for fever.  Gastrointestinal:  Positive for abdominal pain. Negative for constipation, diarrhea, nausea and vomiting.  Genitourinary:  Positive for pelvic pain and vaginal discharge. Negative for dysuria.  Other systems negative  Physical Exam  Patient Vitals for the past 24 hrs:  BP Temp Pulse Resp SpO2 Height Weight  06/29/21 1943 113/73 -- -- -- -- -- --  06/29/21 1941 -- 97.9 F (36.6 C) 79 17 100 % 5\' 7"  (1.702 m) 76.7 kg   Constitutional: Well-developed, well-nourished female in no acute distress.  Cardiovascular: normal rate and rhythm Respiratory: normal effort, clear to auscultation bilaterally GI: Abd soft, non-tender, gravid appropriate for gestational age.   No rebound or guarding. MS: Extremities nontender, no edema, normal ROM Neurologic: Alert and oriented x 4.  GU: Neg CVAT.  PELVIC EXAM: Cervix pink, visually closed, without  lesion, moderate thick white creamy discharge, vaginal walls and external genitalia normal    No ferning, no Pooling Dilation: 2 Effacement (%): 60, 70 Station: -2 Exam by:: Wynelle Bourgeois ,CNM No change after one hour  FHT:  Baseline 130 , moderate variability, accelerations present, no decelerations Contractions: q 2-3 mins Irregular     Labs: Results for orders placed or performed during the hospital encounter of 06/29/21 (from the past 24 hour(s))  Fern Test     Status: None   Collection Time: 06/29/21  8:24 PM  Result  Value Ref Range   POCT Fern Test Negative = intact amniotic membranes    B/Negative/-- (07/25 1633)  Imaging:  No results found.  MAU Course/MDM: I have ordered labs and reviewed results.  NST reviewed, reactive No ferning or pooling on exam No change in cervix over one hour  Treatments in MAU included EFM, SSE.    Assessment: [place] IUP at [redacted]w[redacted]d Vaginal discharge Uterine contractions, likely prodromal  Plan: Discharge home Labor precautions and fetal kick counts Follow up in Office for prenatal visits and recheck Encouraged to return if she develops worsening of symptoms, increase in pain, fever, or other concerning symptoms.   Pt stable at time of discharge.  Wynelle Bourgeois CNM, MSN Certified Nurse-Midwife 06/29/2021 8:34 PM

## 2021-07-02 ENCOUNTER — Encounter: Payer: Self-pay | Admitting: Obstetrics & Gynecology

## 2021-07-02 ENCOUNTER — Ambulatory Visit (INDEPENDENT_AMBULATORY_CARE_PROVIDER_SITE_OTHER): Payer: Medicaid Other | Admitting: Obstetrics & Gynecology

## 2021-07-02 ENCOUNTER — Other Ambulatory Visit: Payer: Self-pay

## 2021-07-02 VITALS — BP 115/73 | HR 74 | Wt 170.4 lb

## 2021-07-02 DIAGNOSIS — Z3480 Encounter for supervision of other normal pregnancy, unspecified trimester: Secondary | ICD-10-CM

## 2021-07-02 NOTE — Progress Notes (Signed)
° °  LOW-RISK PREGNANCY VISIT Patient name: Lindsay Bryant MRN [id number]  Date of birth: 02-03-2001 Chief Complaint:   Routine Prenatal Visit  History of Present Illness:   Lindsay Bryant is a 21 y.o. G32P1001 female at [redacted]w[redacted]d with an Estimated Date of Delivery: 07/09/21 being seen today for ongoing management of a low-risk pregnancy.  Depression screen [facility] 2/9 04/14/2021 12/28/2020 04/03/2019  Decreased Interest 3 3 0  Down, Depressed, Hopeless 2 3 0  PHQ - 2 Score 5 6 0  Altered sleeping 3 2 3   Tired, decreased energy 3 3 3   Change in appetite 0 2 0  Feeling bad or failure about yourself  2 3 0  Trouble concentrating 1 0 0  Moving slowly or fidgety/restless 0 0 0  Suicidal thoughts 1 1 0  PHQ-9 Score 15 17 6     Today she reports occasional contractions. Contractions: Irregular. Vag. Bleeding: None.  Movement: Present. denies leaking of fluid. Review of Systems:   Pertinent items are noted in HPI Denies abnormal vaginal discharge w/ itching/odor/irritation, headaches, visual changes, shortness of breath, chest pain, abdominal pain, severe nausea/vomiting, or problems with urination or bowel movements unless otherwise stated above. Pertinent History Reviewed:  Reviewed past medical,surgical, social, obstetrical and family history.  Reviewed problem list, medications and allergies.  Physical Assessment:   Vitals:   07/02/21 0826  BP: 115/73  Pulse: 74  Weight: 170 lb 6.4 oz (77.3 kg)  Body mass index is 26.69 kg/m.        Physical Examination:   General appearance: Well appearing, and in no distress  Mental status: Alert, oriented to person, place, and time  Skin: Warm & dry  Respiratory: Normal respiratory effort, no distress  Abdomen: Soft, gravid, nontender  Pelvic: Cervical exam performed  Dilation: 1.5 Effacement (%): Thick Station: -3  Extremities: Edema: None  Psych:  mood and affect appropriate  Fetal Status: Fetal Heart Rate (bpm): 125 Fundal Height: 38 cm Movement:  Present Presentation: Vertex  Chaperone:  partner present     No results found for this or any previous visit (from the past 24 hour(s)).   Assessment & Plan:  1) Low-risk pregnancy G2P1001 at [redacted]w[redacted]d with an Estimated Date of Delivery: 07/09/21   GBS neg -expectant management   Meds: No orders of the defined types were placed in this encounter.  Labs/procedures today: doppler  Plan:  Continue routine obstetrical care  Next visit: prefers in person    Reviewed: Term labor symptoms and general obstetric precautions including but not limited to vaginal bleeding, contractions, leaking of fluid and fetal movement were reviewed in detail with the patient.  All questions were answered. Pt has home bp cuff. Check bp weekly, let 07/04/21 know if >140/90.   Follow-up: Return in about 1 week (around 07/09/2021) for LROB visit.  No orders of the defined types were placed in this encounter.   09/06/21, DO Attending Obstetrician & Gynecologist, [facility] for 09/06/2021, [facility]

## 2021-07-04 ENCOUNTER — Inpatient Hospital Stay (HOSPITAL_COMMUNITY)
Admission: AD | Admit: 2021-07-04 | Discharge: 2021-07-04 | Disposition: A | Discharge status: Home / Self Care | Payer: Medicaid Other | Attending: Obstetrics & Gynecology | Admitting: Obstetrics & Gynecology

## 2021-07-04 ENCOUNTER — Encounter (HOSPITAL_COMMUNITY): Payer: Self-pay | Admitting: Obstetrics & Gynecology

## 2021-07-04 DIAGNOSIS — Z3A39 39 weeks gestation of pregnancy: Secondary | ICD-10-CM | POA: Diagnosis not present

## 2021-07-04 DIAGNOSIS — Z3689 Encounter for other specified antenatal screening: Secondary | ICD-10-CM | POA: Diagnosis not present

## 2021-07-04 DIAGNOSIS — Z3493 Encounter for supervision of normal pregnancy, unspecified, third trimester: Secondary | ICD-10-CM | POA: Insufficient documentation

## 2021-07-04 LAB — POCT FERN TEST: POCT Fern Test: NEGATIVE

## 2021-07-04 NOTE — MAU Provider Note (Signed)
Event Date/Time  First Provider Initiated Contact with Patient 07/04/21 0319     S: [name] is a 21 y.o. G2P1001 at [redacted]w[redacted]d  who presents to MAU today complaining of leaking of fluid since 0200. She denies vaginal bleeding. She denies contractions. She reports normal fetal movement.  Pain score ranged from no pain to 2/10 during MAU encounter.  O: BP 117/76 (BP Location: Right Arm)    Pulse (!) 106    Temp 98 F (36.7 C)    Resp 17    Ht 5\' 7"  (1.702 m)    Wt 79 kg    LMP 10/02/2020 (Exact Date)    SpO2 99%    BMI 27.28 kg/m  GENERAL: Well-developed, well-nourished female in no acute distress.  HEAD: Normocephalic, atraumatic.  CHEST: Normal effort of breathing, regular heart rate ABDOMEN: Soft, nontender, gravid PELVIC: Normal external female genitalia. Vagina is pink and rugated. Cervix with normal contour, no lesions. Normal discharge.  Negative pooling.  Negative gush with Valsalva.  Cervical exam:  Dilation: 1.5 Effacement (%): 60 Cervical Position: Posterior Station: -2 Presentation: Vertex Exam by:: [email or web address] RN   Fetal Monitoring: Baseline: 130 Variability: Mod Accelerations: 15 x 15 Decelerations: N/A Contractions: UI, occasional contractions not felt by patient  Results for orders placed or performed during the hospital encounter of 07/04/21 (from the past 24 hour(s))  POCT fern test     Status: Normal   Collection Time: 07/04/21  3:44 AM  Result Value Ref Range   POCT Fern Test Negative = intact amniotic membranes     A: SIUP at [redacted]w[redacted]d  Membranes intact (S/p SSE with CNM, negative pooling, negative fern) Cat I tracing Cervix unchanged from exam two days ago Patient declines labor eval with cervical exam in 90 min  P: Discharge home in stable condition Next appt with [facility] is 01/31  2/31, CNM 07/04/2021 4:13 AM

## 2021-07-04 NOTE — MAU Note (Signed)
..  Lindsay Bryant is a 21 y.o. at [redacted]w[redacted]d here in MAU reporting: LOF clear @ 0200 today. Pt was walking around the living room, and felt a small pop, pt reports her underwear got wet and after changing it once it was wet again. Pt is not currently wearing a pad. Pt reports no CTXs. Pt denies DFM, VB, PIH s/s, and complications in pregnancy. Last intercourse was Friday.  GBS neg  SVE on Friday pt reports being 2cm Onset of complaint: 0200 Pain score: 0/10 Vitals:   07/04/21 0242 07/04/21 0244  BP: 121/76   Pulse: (!) 103   Resp: 18   Temp:  98 F (36.7 C)  SpO2: 100%      FHT:140 Lab orders placed from triage:  none

## 2021-07-04 NOTE — MAU Note (Signed)
Rn and Pt collected fern slides negative-S.Weinhold CNM notified-

## 2021-07-06 ENCOUNTER — Encounter: Payer: Self-pay | Admitting: Women's Health

## 2021-07-06 ENCOUNTER — Ambulatory Visit (INDEPENDENT_AMBULATORY_CARE_PROVIDER_SITE_OTHER): Payer: Medicaid Other | Admitting: Women's Health

## 2021-07-06 ENCOUNTER — Other Ambulatory Visit: Payer: Self-pay

## 2021-07-06 VITALS — BP 123/77 | HR 96 | Wt 170.6 lb

## 2021-07-06 DIAGNOSIS — Z3483 Encounter for supervision of other normal pregnancy, third trimester: Secondary | ICD-10-CM

## 2021-07-06 NOTE — Patient Instructions (Signed)
Lindsay Bryant, thank you for choosing our office today! We appreciate the opportunity to meet your healthcare needs. You may receive a short survey by mail, e-mail, or through [employer]. If you are happy with your care we would appreciate if you could take just a few minutes to complete the survey questions. We read all of your comments and take your feedback very seriously. Thank you again for choosing our office.  Center for [employer] Team at [place] [facility] ([place], [place] [address]) Entrance C, located off of [address] parking   CLASSES: Go to [email or web address] to register for classes (childbirth, breastfeeding, waterbirth, infant CPR, daddy bootcamp, etc.)  Call the office [phone]) or go to [facility] if: You begin to have strong, frequent contractions Your water breaks.  Sometimes it is a big gush of fluid, sometimes it is just a trickle that keeps getting your panties wet or running down your legs You have vaginal bleeding.  It is normal to have a small amount of spotting if your cervix was checked.  You don't feel your baby moving like normal.  If you don't, get you something to eat and drink and lay down and focus on feeling your baby move.   If your baby is still not moving like normal, you should call the office or go to [facility].  Call the office [phone]) or go to [facility] hospital for these signs of pre-eclampsia: Severe headache that does not go away with Tylenol Visual changes- seeing spots, double, blurred vision Pain under your right breast or upper abdomen that does not go away with Tums or heartburn medicine Nausea and/or vomiting Severe swelling in your hands, feet, and face   [facility] Pediatricians/Family Doctors [facility]): [address] [place], [place]: [address] [place], [place] [facility]): [place], [phone] (call to ask if accepting patients) [facility] Department: [address], [place], [facility]): [address], [place] [facility]: [address] [place], [place] [facility]: [place], [facility]): [phone] [facility]: [address] [place]: [address] [place] [facility]: [phone], [phone]  Home Blood Pressure Monitoring for Patients   Your provider has recommended that you check your blood pressure (BP) at least once a week at home. If you do not have a blood pressure cuff at home, one will be provided for you. Contact your provider if you have not received your monitor within 1 week.   Helpful Tips for Accurate Home Blood Pressure Checks  Don't smoke, exercise, or drink caffeine 30 minutes before checking your BP Use the restroom before checking your BP (a full bladder can raise your pressure) Relax in a comfortable upright chair Feet on the ground Left arm resting comfortably on a flat surface at the level of your heart Legs uncrossed Back supported Sit quietly and don't talk Place the cuff on your bare arm Adjust snuggly, so that only two fingertips  can fit between your skin and the top of the cuff Check 2 readings separated by at least one minute Keep a log of your BP readings For a visual, please reference this diagram: http://ccnc.care/bpdiagram  Provider Name: [facility]     Phone: [phone]  Zone 1: ALL CLEAR  Continue to monitor your symptoms:  BP reading is less than 140 (top number) or less than 90 (bottom number)  No right  upper stomach pain No headaches or seeing spots No feeling nauseated or throwing up No swelling in face and hands  Zone 2: CAUTION Call your doctor's office for any of the following:  BP reading is greater than 140 (top number) or greater than 90 (bottom number)  Stomach pain under your ribs in the middle or right side Headaches or seeing spots Feeling nauseated or throwing up Swelling in face and hands  Zone 3: EMERGENCY  Seek immediate medical care if you have any of the following:  BP reading is greater than160 (top number) or greater than 110 (bottom number) Severe headaches not improving with Tylenol Serious difficulty catching your breath Any worsening symptoms from Zone 2   Braxton Hicks Contractions Contractions of the uterus can occur throughout pregnancy, but they are not always a sign that you are in labor. You may have practice contractions called Braxton Hicks contractions. These false labor contractions are sometimes confused with true labor. What are [place] contractions? Braxton Hicks contractions are tightening movements that occur in the muscles of the uterus before labor. Unlike true labor contractions, these contractions do not result in opening (dilation) and thinning of the cervix. Toward the end of pregnancy (32-34 weeks), Braxton Hicks contractions can happen more often and may become stronger. These contractions are sometimes difficult to tell apart from true labor because they can be very uncomfortable. You should not feel embarrassed if you go to the hospital with false labor. Sometimes, the only way to tell if you are in true labor is for your health care provider to look for changes in the cervix. The health care provider will do a physical exam and may monitor your contractions. If you are not in true labor, the exam should show that your cervix is not dilating and your water has not broken. If there are no other health problems associated with your  pregnancy, it is completely safe for you to be sent home with false labor. You may continue to have Braxton Hicks contractions until you go into true labor. How to tell the difference between true labor and false labor True labor Contractions last 30-70 seconds. Contractions become very regular. Discomfort is usually felt in the top of the uterus, and it spreads to the lower abdomen and low back. Contractions do not go away with walking. Contractions usually become more intense and increase in frequency. The cervix dilates and gets thinner. False labor Contractions are usually shorter and not as strong as true labor contractions. Contractions are usually irregular. Contractions are often felt in the front of the lower abdomen and in the groin. Contractions may go away when you walk around or change positions while lying down. Contractions get weaker and are shorter-lasting as time goes on. The cervix usually does not dilate or become thin. Follow these instructions at home:  Take over-the-counter and prescription medicines only as told by your health care provider. Keep up with your usual exercises and follow other instructions from your health care provider. Eat and drink lightly if you think  you are going into labor. If Braxton Hicks contractions are making you uncomfortable: Change your position from lying down or resting to walking, or change from walking to resting. Sit and rest in a tub of warm water. Drink enough fluid to keep your urine pale yellow. Dehydration may cause these contractions. Do slow and deep breathing several times an hour. Keep all follow-up prenatal visits as told by your health care provider. This is important. Contact a health care provider if: You have a fever. You have continuous pain in your abdomen. Get help right away if: Your contractions become stronger, more regular, and closer together. You have fluid leaking or gushing from your vagina. You pass  blood-tinged mucus (bloody show). You have bleeding from your vagina. You have low back pain that you never had before. You feel your babys head pushing down and causing pelvic pressure. Your baby is not moving inside you as much as it used to. Summary Contractions that occur before labor are called Braxton Hicks contractions, false labor, or practice contractions. Braxton Hicks contractions are usually shorter, weaker, farther apart, and less regular than true labor contractions. True labor contractions usually become progressively stronger and regular, and they become more frequent. Manage discomfort from [facility] contractions by changing position, resting in a warm bath, drinking plenty of water, or practicing deep breathing. This information is not intended to replace advice given to you by your health care provider. Make sure you discuss any questions you have with your health care provider. Document Revised: 05/05/2017 Document Reviewed: 10/06/2016 Elsevier Patient Education  [place].

## 2021-07-06 NOTE — Progress Notes (Signed)
° ° °  LOW-RISK PREGNANCY VISIT Patient name: Lindsay Bryant MRN [id number]  Date of birth: 2000-09-21 Chief Complaint:   Routine Prenatal Visit  History of Present Illness:   Lindsay Bryant is a 21 y.o. G100P1001 female at [redacted]w[redacted]d with an Estimated Date of Delivery: 07/09/21 being seen today for ongoing management of a low-risk pregnancy.   Today she reports occasional contractions. Contractions: Irregular. Vag. Bleeding: None.  Movement: Present. denies leaking of fluid.  Depression screen [facility] 2/9 04/14/2021 12/28/2020 04/03/2019  Decreased Interest 3 3 0  Down, Depressed, Hopeless 2 3 0  PHQ - 2 Score 5 6 0  Altered sleeping 3 2 3   Tired, decreased energy 3 3 3   Change in appetite 0 2 0  Feeling bad or failure about yourself  2 3 0  Trouble concentrating 1 0 0  Moving slowly or fidgety/restless 0 0 0  Suicidal thoughts 1 1 0  PHQ-9 Score 15 17 6      GAD 7 : Generalized Anxiety Score 04/14/2021 12/28/2020  Nervous, Anxious, on Edge 2 2  Control/stop worrying 2 2  Worry too much - different things 3 3  Trouble relaxing 1 2  Restless 0 0  Easily annoyed or irritable 2 3  Afraid - awful might happen 1 1  Total GAD 7 Score 11 13      Review of Systems:   Pertinent items are noted in HPI Denies abnormal vaginal discharge w/ itching/odor/irritation, headaches, visual changes, shortness of breath, chest pain, abdominal pain, severe nausea/vomiting, or problems with urination or bowel movements unless otherwise stated above. Pertinent History Reviewed:  Reviewed past medical,surgical, social, obstetrical and family history.  Reviewed problem list, medications and allergies. Physical Assessment:   Vitals:   07/06/21 0830  BP: 123/77  Pulse: 96  Weight: 170 lb 9.6 oz (77.4 kg)  Body mass index is 26.72 kg/m.        Physical Examination:   General appearance: Well appearing, and in no distress  Mental status: Alert, oriented to person, place, and time  Skin: Warm & dry  Cardiovascular:  Normal heart rate noted  Respiratory: Normal respiratory effort, no distress  Abdomen: Soft, gravid, nontender  Pelvic: Cervical exam performed  Dilation: 3 Effacement (%): 70 Station: -3, Offered membrane sweeping, discussed r/b- pt decided to proceed, so membranes swept.   Extremities: Edema: None  Fetal Status: Fetal Heart Rate (bpm): 133 Fundal Height: 39 cm Movement: Present Presentation: Vertex  Chaperone: Angel Neas   No results found for this or any previous visit (from the past 24 hour(s)).  Assessment & Plan:  1) Low-risk pregnancy G2P1001 at [redacted]w[redacted]d with an Estimated Date of Delivery: 07/09/21    Meds: No orders of the defined types were placed in this encounter.  Labs/procedures today: SVE and membrane sweep  Plan:  Continue routine obstetrical care  Next visit: prefers will be in person for postdates nst     Reviewed: Term labor symptoms and general obstetric precautions including but not limited to vaginal bleeding, contractions, leaking of fluid and fetal movement were reviewed in detail with the patient.  All questions were answered. Does have home bp cuff. Office bp cuff given: not applicable. Check bp weekly, let 07/08/21 know if consistently >140 and/or >90.  Follow-up: Return in about 1 week (around 07/13/2021) for LROB, NST, CNM, in person.  No future appointments.  No orders of the defined types were placed in this encounter.  09/06/21 CNM, [facility] 07/06/2021 8:46 AM

## 2021-07-07 ENCOUNTER — Inpatient Hospital Stay (HOSPITAL_COMMUNITY): Payer: Medicaid Other | Admitting: Anesthesiology

## 2021-07-07 ENCOUNTER — Encounter (HOSPITAL_COMMUNITY): Payer: Self-pay | Admitting: Obstetrics & Gynecology

## 2021-07-07 ENCOUNTER — Inpatient Hospital Stay (HOSPITAL_COMMUNITY)
Admission: AD | Admit: 2021-07-07 | Discharge: 2021-07-10 | DRG: 805 | Disposition: A | Discharge status: Home / Self Care | Payer: Medicaid Other | Attending: Obstetrics & Gynecology | Admitting: Obstetrics & Gynecology

## 2021-07-07 DIAGNOSIS — O26893 Other specified pregnancy related conditions, third trimester: Secondary | ICD-10-CM | POA: Diagnosis not present

## 2021-07-07 DIAGNOSIS — Z6791 Unspecified blood type, Rh negative: Secondary | ICD-10-CM | POA: Diagnosis not present

## 2021-07-07 DIAGNOSIS — Z30017 Encounter for initial prescription of implantable subdermal contraceptive: Secondary | ICD-10-CM | POA: Diagnosis not present

## 2021-07-07 DIAGNOSIS — O411230 Chorioamnionitis, third trimester, not applicable or unspecified: Secondary | ICD-10-CM | POA: Diagnosis present

## 2021-07-07 DIAGNOSIS — Z20822 Contact with and (suspected) exposure to covid-19: Secondary | ICD-10-CM | POA: Diagnosis present

## 2021-07-07 DIAGNOSIS — O26899 Other specified pregnancy related conditions, unspecified trimester: Secondary | ICD-10-CM

## 2021-07-07 DIAGNOSIS — Z3A39 39 weeks gestation of pregnancy: Secondary | ICD-10-CM | POA: Diagnosis not present

## 2021-07-07 DIAGNOSIS — O4202 Full-term premature rupture of membranes, onset of labor within 24 hours of rupture: Secondary | ICD-10-CM | POA: Diagnosis not present

## 2021-07-07 DIAGNOSIS — Z3480 Encounter for supervision of other normal pregnancy, unspecified trimester: Secondary | ICD-10-CM

## 2021-07-07 LAB — TYPE AND SCREEN
ABO/RH(D): B NEG
Antibody Screen: POSITIVE

## 2021-07-07 LAB — CBC
HCT: 36.7 % (ref 36.0–46.0)
Hemoglobin: 11.7 g/dL — ABNORMAL LOW (ref 12.0–15.0)
MCH: 27.8 pg (ref 26.0–34.0)
MCHC: 31.9 g/dL (ref 30.0–36.0)
MCV: 87.2 fL (ref 80.0–100.0)
Platelets: 202 10*3/uL (ref 150–400)
RBC: 4.21 MIL/uL (ref 3.87–5.11)
RDW: 13.5 % (ref 11.5–15.5)
WBC: 14.5 10*3/uL — ABNORMAL HIGH (ref 4.0–10.5)
nRBC: 0 % (ref 0.0–0.2)

## 2021-07-07 LAB — POCT FERN TEST
POCT Fern Test: NEGATIVE
POCT Fern Test: POSITIVE

## 2021-07-07 LAB — RESP PANEL BY RT-PCR (FLU A&B, COVID) ARPGX2
Influenza A by PCR: NEGATIVE
Influenza B by PCR: NEGATIVE
SARS Coronavirus 2 by RT PCR: NEGATIVE

## 2021-07-07 MED ORDER — EPHEDRINE 5 MG/ML INJ: 10.0000 mg | INTRAVENOUS | Status: DC | PRN

## 2021-07-07 MED ORDER — OXYCODONE-ACETAMINOPHEN 5-325 MG PO TABS: 1.0000 | ORAL_TABLET | ORAL | Status: DC | PRN

## 2021-07-07 MED ORDER — LACTATED RINGERS IV SOLN: INTRAVENOUS | Status: DC

## 2021-07-07 MED ORDER — FENTANYL CITRATE (PF) 100 MCG/2ML IJ SOLN
50.0000 ug | Freq: Once | INTRAMUSCULAR | Status: AC
  Administered 2021-07-07: 50 ug via INTRAVENOUS
  Filled 2021-07-07: qty 2

## 2021-07-07 MED ORDER — TERBUTALINE SULFATE 1 MG/ML IJ SOLN: 0.2500 mg | Freq: Once | INTRAMUSCULAR | Status: DC | PRN

## 2021-07-07 MED ORDER — OXYTOCIN BOLUS FROM INFUSION
333.0000 mL | Freq: Once | INTRAVENOUS | Status: AC
  Administered 2021-07-08: 333 mL via INTRAVENOUS

## 2021-07-07 MED ORDER — OXYTOCIN-SODIUM CHLORIDE 30-0.9 UT/500ML-% IV SOLN
2.5000 [IU]/h | INTRAVENOUS | Status: DC
  Filled 2021-07-07: qty 500

## 2021-07-07 MED ORDER — PHENYLEPHRINE 40 MCG/ML (10ML) SYRINGE FOR IV PUSH (FOR BLOOD PRESSURE SUPPORT): 80.0000 ug | PREFILLED_SYRINGE | INTRAVENOUS | Status: DC | PRN

## 2021-07-07 MED ORDER — OXYCODONE-ACETAMINOPHEN 5-325 MG PO TABS: 2.0000 | ORAL_TABLET | ORAL | Status: DC | PRN

## 2021-07-07 MED ORDER — DIPHENHYDRAMINE HCL 50 MG/ML IJ SOLN: 12.5000 mg | INTRAMUSCULAR | Status: DC | PRN

## 2021-07-07 MED ORDER — OXYTOCIN-SODIUM CHLORIDE 30-0.9 UT/500ML-% IV SOLN
1.0000 m[IU]/min | INTRAVENOUS | Status: DC
  Administered 2021-07-07: 2 m[IU]/min via INTRAVENOUS

## 2021-07-07 MED ORDER — FENTANYL-BUPIVACAINE-NACL 0.5-0.125-0.9 MG/250ML-% EP SOLN: 12.0000 mL/h | EPIDURAL | Status: DC | PRN

## 2021-07-07 MED ORDER — SOD CITRATE-CITRIC ACID 500-334 MG/5ML PO SOLN: 30.0000 mL | ORAL | Status: DC | PRN

## 2021-07-07 MED ORDER — ONDANSETRON HCL 4 MG/2ML IJ SOLN
4.0000 mg | Freq: Four times a day (QID) | INTRAMUSCULAR | Status: DC | PRN
  Administered 2021-07-07 – 2021-07-08 (×2): 4 mg via INTRAVENOUS
  Filled 2021-07-07 (×2): qty 2

## 2021-07-07 MED ORDER — LACTATED RINGERS IV SOLN
500.0000 mL | INTRAVENOUS | Status: DC | PRN
  Administered 2021-07-07 (×2): 500 mL via INTRAVENOUS

## 2021-07-07 MED ORDER — FENTANYL CITRATE (PF) 100 MCG/2ML IJ SOLN: 50.0000 ug | INTRAMUSCULAR | Status: DC | PRN

## 2021-07-07 MED ORDER — LACTATED RINGERS IV SOLN: 500.0000 mL | Freq: Once | INTRAVENOUS | Status: DC

## 2021-07-07 MED ORDER — FENTANYL-BUPIVACAINE-NACL 0.5-0.125-0.9 MG/250ML-% EP SOLN
12.0000 mL/h | EPIDURAL | Status: DC | PRN
  Administered 2021-07-07 – 2021-07-08 (×2): 12 mL/h via EPIDURAL
  Filled 2021-07-07 (×2): qty 250

## 2021-07-07 MED ORDER — LIDOCAINE-EPINEPHRINE (PF) 2 %-1:200000 IJ SOLN
INTRAMUSCULAR | Status: DC | PRN
  Administered 2021-07-07: 5 mL via EPIDURAL

## 2021-07-07 MED ORDER — LACTATED RINGERS IV SOLN
500.0000 mL | Freq: Once | INTRAVENOUS | Status: AC
  Administered 2021-07-07: 500 mL via INTRAVENOUS

## 2021-07-07 MED ORDER — LIDOCAINE HCL (PF) 1 % IJ SOLN: 30.0000 mL | INTRAMUSCULAR | Status: DC | PRN

## 2021-07-07 MED ORDER — ACETAMINOPHEN 325 MG PO TABS
650.0000 mg | ORAL_TABLET | ORAL | Status: DC | PRN
  Administered 2021-07-07 – 2021-07-08 (×2): 650 mg via ORAL
  Filled 2021-07-07 (×2): qty 2

## 2021-07-07 NOTE — Progress Notes (Signed)
S: Patient feeling more vaginal pressure. Patient has no other questions/ concerns at this time.   O: Vitals:   07/07/21 2201 07/07/21 2231 07/07/21 2301 07/07/21 2331  BP: (!) 95/52 118/67 107/76 118/87  Pulse: 87 93 (!) 107 (!) 102  Resp:    19  Temp:    98.9 F (37.2 C)  TempSrc:    Axillary  SpO2:      Weight:      Height:        FHT:  FHR: 125 bpm, variability: moderate,  accelerations:  Present,  decelerations:  Present early  UC:   regular, every 1-3 minutes SVE:   Dilation: 9 Effacement (%): 90 Station: 0 Exam by:: Keith Rake RN  A / P: 21 y.o. G2P1001 [redacted]w[redacted]d spontaneous labor progressing normally -Discussed Pitocin for labor augmentation at 2100 and patient agreed with the plan -GBS negative  Fetal Wellbeing:  Category I Pain Control:  Epidural Anticipated MOD:  NSVD  Christiane Ha, SNM 07/07/2021, 11:59 PM

## 2021-07-07 NOTE — Plan of Care (Signed)
°  Problem: Education: Goal: Ability to make informed decisions regarding treatment and plan of care will improve Outcome: Progressing   Problem: Pain Management: Goal: Relief or control of pain from uterine contractions will improve Outcome: Progressing   Problem: Education: Goal: Knowledge of General Education information will improve Description: Including pain rating scale, medication(s)/side effects and non-pharmacologic comfort measures Outcome: Progressing

## 2021-07-07 NOTE — H&P (Addendum)
OBSTETRIC ADMISSION HISTORY AND PHYSICAL  Giannie Soliday is a 21 y.o. female G2P1001 with IUP at [redacted]w[redacted]d by LMP who presented initially to MAU for increased frequency of CTX. During assessment, SROM confirmed on fern test at 8:30am. She reports +FMs, no VB. She plans on breast feeding. She requests Nexplanon for birth control postpartum.  She received her prenatal care at [facility].   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 pal Ab on 12/28/20. No hx or tx of syphilis, consistent with false positive RPR testing. - Chlamydia infection in second trimester treated with Azithromycin, TOC negative 1/10  Past Medical History: Past Medical History:  Diagnosis Date   Anemia     Past Surgical History: Past Surgical History:  Procedure Laterality Date   TONSILLECTOMY      Obstetrical History: OB History     Gravida  2   Para  1   Term  1   Preterm      AB      Living  1      SAB      IAB      Ectopic      Multiple  0   Live Births  1           Social History Social History   Socioeconomic History   Marital status: Married    Spouse name: 12/30/20   Number of children: Not on file   Years of education: Not on file   Highest education level: High school graduate  Occupational History   Not on file  Tobacco Use   Smoking status: Never   Smokeless tobacco: Never  Vaping Use   Vaping Use: Never used  Substance and Sexual Activity   Alcohol use: Never   Drug use: Never   Sexual activity: Yes    Birth control/protection: None  Other Topics Concern   Not on file  Social History Narrative   Not on file   Social Determinants of Health   Financial Resource Strain: Low Risk    Difficulty of Paying Living Expenses: Not very hard  Food Insecurity: Food Insecurity  Present   Worried About Running Out of Food in the Last Year: Sometimes true   Ran Out of Food in the Last Year: Sometimes true  Transportation Needs: No Transportation Needs   Lack of Transportation (Medical): No   Lack of Transportation (Non-Medical): No  Physical Activity: Insufficiently Active   Days of Exercise per Week: 3 days   Minutes of Exercise per Session: 40 min  Stress: Stress Concern Present   Feeling of Stress : Very much  Social Connections: Moderately Integrated   Frequency of Communication with Friends and Family: More than three times a week   Frequency of Social Gatherings with Friends and Family: Never   Attends Religious Services: More than 4 times per year   Active Member of Sales promotion account executive or Organizations: No   Attends [employer] Meetings: Never   Marital Status: Married    Family History: Family History  Adopted: Yes    Allergies: No Known Allergies  Medications Prior to Admission  Medication Sig Dispense Refill Last Dose   Prenatal Vit-Fe Fumarate-FA (PRENATAL VITAMIN PO) Take by mouth.   07/06/2021   promethazine (PHENERGAN) 25 MG tablet Take 1 tablet (25 mg total)  by mouth every 6 (six) hours as needed for nausea or vomiting. 30 tablet 1      Review of Systems  All systems reviewed and negative except as stated in HPI  Blood pressure 103/63, pulse 86, temperature 98.5 F (36.9 C), temperature source Oral, resp. rate 18, height 5\' 7"  (1.702 m), weight 77.4 kg, last menstrual period 10/02/2020, SpO2 99 %.  General appearance: alert, cooperative, and mild distress from contractions Lungs: normal WOB on room air  Heart: normal rate, warm and well perfused Abdomen: soft, non-tender, gravid Extremities: no lower extremity edema or calf tenderness to palpation  Presentation: Cephalic Fetal monitoring: Baseline: 135 bpm, moderate variability, + accels, no decels  Uterine activity: Every 2-3 minutes  Dilation: 4 Effacement (%): 70 Station:  -3 Exam by:: Verne GrainAshley Tuttle, RN   Prenatal labs: ABO, Rh: --/--/B NEG (02/01 0755) Antibody: POS (02/01 0755) Rubella: 1.37 (07/25 1633) RPR: Reactive (11/09 0825)  HBsAg: Negative (07/25 1633)  HIV: Non Reactive (11/09 0825)  GBS: Negative/-- (01/10 1649)  2 hr Glucola normal Genetic screening normal Anatomy US isolated LVICEF 2.625mm, otherwise normal  Prenatal Transfer Tool  Maternal Diabetes: No Genetic Screening: Normal Maternal Ultrasounds/Referrals: Isolated EIF (echogenic intracardiac focus) Fetal Ultrasounds or other Referrals:  None Maternal Substance Abuse:  No Significant Maternal Medications:  None Significant Maternal Lab Results: Rh negative  Results for orders placed or performed during the hospital encounter of 07/07/21 (from the past 24 hour(s))  Fern Test   Collection Time: 07/07/21  7:35 AM  Result Value Ref Range   POCT Fern Test Negative = intact amniotic membranes   CBC   Collection Time: 07/07/21  7:55 AM  Result Value Ref Range   WBC 14.5 (H) 4.0 - 10.5 K/uL   RBC 4.21 3.87 - 5.11 MIL/uL   Hemoglobin 11.7 (L) 12.0 - 15.0 g/dL   HCT 40.936.7 81.136.0 - 91.446.0 %   MCV 87.2 80.0 - 100.0 fL   MCH 27.8 26.0 - 34.0 pg   MCHC 31.9 30.0 - 36.0 g/dL   RDW 78.213.5 95.611.5 - 21.315.5 %   Platelets 202 150 - 400 K/uL   nRBC 0.0 0.0 - 0.2 %  Type and screen [facility]   Collection Time: 07/07/21  7:55 AM  Result Value Ref Range   ABO/RH(D) B NEG    Antibody Screen POS    Sample Expiration 07/10/2021,2359    Antibody Identification      PASSIVELY ACQUIRED ANTI-D Performed at [facility], [address] [place], [place] [address]   Crist FatFern Test   Collection Time: 07/07/21  8:30 AM  Result Value Ref Range   POCT Fern Test Positive = ruptured amniotic membanes   Resp Panel by RT-PCR (Flu A&B, Covid) Nasopharyngeal Swab   Collection Time: 07/07/21  8:37 AM   Specimen: Nasopharyngeal Swab; Nasopharyngeal(NP) swabs in vial transport medium  Result  Value Ref Range   SARS Coronavirus 2 by RT PCR NEGATIVE NEGATIVE   Influenza A by PCR NEGATIVE NEGATIVE   Influenza B by PCR NEGATIVE NEGATIVE    Patient Active Problem List   Diagnosis Date Noted   Indication for care in labor and delivery, antepartum 07/07/2021   Chlamydia 01/11/2021   Rh negative state in antepartum period 12/28/2020   Encounter for supervision of normal pregnancy, antepartum 12/23/2020   Dyspareunia in female 02/05/2020    Assessment/Plan:  Wyvonne LenzSabrina Aldridge is a 21 year old G2P1001 admitted to L&D for contractions and SROM.   #  Labor: Favorable SVE. Contractions q2-65min. Continue with expectant management. Will reassess in 4 hours.  #Pain: Planning for epidural #FWB: Cat 1 #ID:  GBS negative  #MOF: Breast #MOC: Nexplanon inpatient  #Rh negative: Plan for Rhogam evaluation postpartum.  #RPR reactive: RPR reactive this pregnancy, most recently on 04/14/21. Negative T pallidum Ab. Consistent with false positive RPR. Will follow up admit testing.  #Hx of chlamydia: Treated with negative TOC.   Worthy Rancher, MD  07/07/2021, 11:11 AM

## 2021-07-07 NOTE — MAU Note (Signed)
Pt presented to MAU with CTX every 2-3 mins starting 2am. PT denies VB, LOF, DFM, and complications in the pregnancy.   SVE in the office 3cm yesterday. Pt had a membrane sweep done as well.  GBS neg  FHR 130

## 2021-07-07 NOTE — Progress Notes (Signed)
S: Patient feeling intermittent vaginal pressure.Spouse, Leif at bedside. Patient has no questions/ concerns at this time. Anticipating arrival of baby girl Alaina.   O: Vitals:   07/07/21 1801 07/07/21 1831 07/07/21 1901 07/07/21 1931  BP: 126/80 128/76 111/79 102/62  Pulse: (!) 102 97 97 90  Resp: 16 16 18 14   Temp: 98.1 F (36.7 C)     TempSrc: Oral     SpO2:      Weight:      Height:        FHT:  FHR: 120 bpm, variability: moderate,  accelerations:  Present,  decelerations:  Present early  UC:   regular, every 2-3.5 minutes SVE:   Dilation: 7 Effacement (%): 80 Station: -2 Exam by:: Davis,RN  A / P: 20 y.o. G2P1001 [redacted]w[redacted]d spontaneous labor progressing normally  -GBS negative  -Expectant management  -Fetal Wellbeing:  Category I -Pain Control:  Epidural -Anticipated MOD:  NSVD  [redacted]w[redacted]d, SNM 07/07/2021, 8:03 PM

## 2021-07-07 NOTE — Anesthesia Procedure Notes (Signed)
Epidural Patient location during procedure: OB Start time: 07/07/2021 9:45 AM End time: 07/07/2021 9:51 AM  Staffing Anesthesiologist: Shelton Silvas, MD Performed: anesthesiologist   Preanesthetic Checklist Completed: patient identified, IV checked, site marked, risks and benefits discussed, surgical consent, monitors and equipment checked, pre-op evaluation and timeout performed  Epidural Patient position: sitting Prep: DuraPrep Patient monitoring: heart rate, continuous pulse ox and blood pressure Approach: midline Location: L3-L4 Injection technique: LOR saline  Needle:  Needle type: Tuohy  Needle gauge: 17 G Needle length: 9 cm Catheter type: closed end flexible Catheter size: 20 Guage Test dose: negative and 1.5% lidocaine  Assessment Events: blood not aspirated, injection not painful, no injection resistance and no paresthesia  Additional Notes LOR @ 5  Patient identified. Risks/Benefits/Options discussed with patient including but not limited to bleeding, infection, nerve damage, paralysis, failed block, incomplete pain control, headache, blood pressure changes, nausea, vomiting, reactions to medications, itching and postpartum back pain. Confirmed with bedside nurse the patient's most recent platelet count. Confirmed with patient that they are not currently taking any anticoagulation, have any bleeding history or any family history of bleeding disorders. Patient expressed understanding and wished to proceed. All questions were answered. Sterile technique was used throughout the entire procedure. Please see nursing notes for vital signs. Test dose was given through epidural catheter and negative prior to continuing to dose epidural or start infusion. Warning signs of high block given to the patient including shortness of breath, tingling/numbness in hands, complete motor block, or any concerning symptoms with instructions to call for help. Patient was given instructions on fall  risk and not to get out of bed. All questions and concerns addressed with instructions to call with any issues or inadequate analgesia.    Reason for block:procedure for pain

## 2021-07-07 NOTE — Progress Notes (Signed)
Labor Progress Note Lindsay Bryant is a 21 y.o. G2P1001 at [redacted]w[redacted]d who presented for SOL/SROM.   S: Doing well. Comfortable with epidural. No concerns at this time.   O:  BP (!) 98/54    Pulse 80    Temp 98.8 F (37.1 C) (Oral)    Resp 16    Ht 5\' 7"  (1.702 m)    Wt 77.4 kg    LMP 10/02/2020 (Exact Date)    SpO2 99%    BMI 26.73 kg/m   EFM: Baseline 125 bpm, moderate variability, + accels, no decels  Toco: Every 2 minutes   CVE: Dilation: 5 Effacement (%): 80 Cervical Position: Middle Station: -2 Presentation: Vertex Exam by:: Dr. [email or web address]  A&P: 20 y.o. G2P1001 [redacted]w[redacted]d   #Labor: Progressing well. Forebag noted this check. AROM performed with clear fluid. Mom and baby tolerated this well. Will reassess in 3-4 hours. Plan to add Pitocin if contractions space or if progress stalls.  #Pain: Epidural  #FWB: Cat 1  #GBS negative  [redacted]w[redacted]d, MD 2:55 PM

## 2021-07-07 NOTE — Anesthesia Preprocedure Evaluation (Signed)
Anesthesia Evaluation  Patient identified by MRN, date of birth, ID band Patient awake    Reviewed: Allergy & Precautions, Patient's Chart, lab work & pertinent test results  Airway Mallampati: I       Dental no notable dental hx.    Pulmonary    Pulmonary exam normal        Cardiovascular negative cardio ROS Normal cardiovascular exam     Neuro/Psych    GI/Hepatic   Endo/Other    Renal/GU      Musculoskeletal   Abdominal Normal abdominal exam  (+)   Peds  Hematology   Anesthesia Other Findings   Reproductive/Obstetrics (+) Pregnancy                             Anesthesia Physical Anesthesia Plan  ASA: 2  Anesthesia Plan: Epidural   Post-op Pain Management:    Induction:   PONV Risk Score and Plan: 0  Airway Management Planned: Natural Airway  Additional Equipment: None  Intra-op Plan:   Post-operative Plan:   Informed Consent: I have reviewed the patients History and Physical, chart, labs and discussed the procedure including the risks, benefits and alternatives for the proposed anesthesia with the patient or authorized representative who has indicated his/her understanding and acceptance.       Plan Discussed with:   Anesthesia Plan Comments: (Lab Results      Component                Value               Date                      WBC                      14.5 (H)            07/07/2021                HGB                      11.7 (L)            07/07/2021                HCT                      36.7                07/07/2021                MCV                      87.2                07/07/2021                PLT                      202                 07/07/2021           )        Anesthesia Quick Evaluation

## 2021-07-08 ENCOUNTER — Encounter (HOSPITAL_COMMUNITY): Payer: Self-pay | Admitting: Family Medicine

## 2021-07-08 DIAGNOSIS — O411230 Chorioamnionitis, third trimester, not applicable or unspecified: Secondary | ICD-10-CM

## 2021-07-08 DIAGNOSIS — O4202 Full-term premature rupture of membranes, onset of labor within 24 hours of rupture: Secondary | ICD-10-CM

## 2021-07-08 DIAGNOSIS — Z3A39 39 weeks gestation of pregnancy: Secondary | ICD-10-CM

## 2021-07-08 LAB — RPR: RPR Ser Ql: NONREACTIVE

## 2021-07-08 MED ORDER — DIBUCAINE (PERIANAL) 1 % EX OINT: 1.0000 "application " | TOPICAL_OINTMENT | CUTANEOUS | Status: DC | PRN

## 2021-07-08 MED ORDER — ZOLPIDEM TARTRATE 5 MG PO TABS: 5.0000 mg | ORAL_TABLET | Freq: Every evening | ORAL | Status: DC | PRN

## 2021-07-08 MED ORDER — SENNOSIDES-DOCUSATE SODIUM 8.6-50 MG PO TABS
2.0000 | ORAL_TABLET | Freq: Every day | ORAL | Status: DC
  Administered 2021-07-09 – 2021-07-10 (×2): 2 via ORAL
  Filled 2021-07-08 (×2): qty 2

## 2021-07-08 MED ORDER — ONDANSETRON HCL 4 MG PO TABS: 4.0000 mg | ORAL_TABLET | ORAL | Status: DC | PRN

## 2021-07-08 MED ORDER — GENTAMICIN SULFATE 40 MG/ML IJ SOLN
5.0000 mg/kg | Freq: Once | INTRAVENOUS | Status: AC
  Administered 2021-07-08: 340 mg via INTRAVENOUS
  Filled 2021-07-08: qty 8.5

## 2021-07-08 MED ORDER — TETANUS-DIPHTH-ACELL PERTUSSIS 5-2.5-18.5 LF-MCG/0.5 IM SUSY: 0.5000 mL | PREFILLED_SYRINGE | Freq: Once | INTRAMUSCULAR | Status: DC

## 2021-07-08 MED ORDER — BENZOCAINE-MENTHOL 20-0.5 % EX AERO
1.0000 "application " | INHALATION_SPRAY | CUTANEOUS | Status: DC | PRN
  Administered 2021-07-08: 1 via TOPICAL
  Filled 2021-07-08: qty 56

## 2021-07-08 MED ORDER — LIDOCAINE HCL 1 % IJ SOLN
0.0000 mL | Freq: Once | INTRAMUSCULAR | Status: AC | PRN
  Administered 2021-07-10: 3 mL via INTRADERMAL
  Filled 2021-07-08: qty 20

## 2021-07-08 MED ORDER — RHO D IMMUNE GLOBULIN 1500 UNIT/2ML IJ SOSY
300.0000 ug | PREFILLED_SYRINGE | Freq: Once | INTRAMUSCULAR | Status: AC
  Administered 2021-07-08: 300 ug via INTRAVENOUS
  Filled 2021-07-08: qty 2

## 2021-07-08 MED ORDER — PRENATAL MULTIVITAMIN CH
1.0000 | ORAL_TABLET | Freq: Every day | ORAL | Status: DC
  Administered 2021-07-08 – 2021-07-10 (×3): 1 via ORAL
  Filled 2021-07-08 (×3): qty 1

## 2021-07-08 MED ORDER — SODIUM CHLORIDE 0.9 % IV SOLN
2.0000 g | Freq: Once | INTRAVENOUS | Status: AC
  Administered 2021-07-08: 2 g via INTRAVENOUS
  Filled 2021-07-08: qty 2000

## 2021-07-08 MED ORDER — SIMETHICONE 80 MG PO CHEW: 80.0000 mg | CHEWABLE_TABLET | ORAL | Status: DC | PRN

## 2021-07-08 MED ORDER — ETONOGESTREL 68 MG ~~LOC~~ IMPL
68.0000 mg | DRUG_IMPLANT | Freq: Once | SUBCUTANEOUS | Status: AC
  Administered 2021-07-10: 68 mg via SUBCUTANEOUS
  Filled 2021-07-08: qty 1

## 2021-07-08 MED ORDER — COCONUT OIL OIL: 1.0000 "application " | TOPICAL_OIL | Status: DC | PRN

## 2021-07-08 MED ORDER — ONDANSETRON HCL 4 MG/2ML IJ SOLN: 4.0000 mg | INTRAMUSCULAR | Status: DC | PRN

## 2021-07-08 MED ORDER — IBUPROFEN 600 MG PO TABS
600.0000 mg | ORAL_TABLET | Freq: Four times a day (QID) | ORAL | Status: DC
  Administered 2021-07-08 – 2021-07-10 (×9): 600 mg via ORAL
  Filled 2021-07-08 (×9): qty 1

## 2021-07-08 MED ORDER — ACETAMINOPHEN 325 MG PO TABS: 650.0000 mg | ORAL_TABLET | ORAL | Status: DC | PRN

## 2021-07-08 MED ORDER — DIPHENHYDRAMINE HCL 25 MG PO CAPS: 25.0000 mg | ORAL_CAPSULE | Freq: Four times a day (QID) | ORAL | Status: DC | PRN

## 2021-07-08 MED ORDER — WITCH HAZEL-GLYCERIN EX PADS: 1.0000 "application " | MEDICATED_PAD | CUTANEOUS | Status: DC | PRN

## 2021-07-08 MED ORDER — OXYCODONE HCL 5 MG PO TABS: 5.0000 mg | ORAL_TABLET | ORAL | Status: DC | PRN

## 2021-07-08 NOTE — Progress Notes (Signed)
S: Patient feeling rectal pressure and tearful from the pressure.   O: Vitals:   07/08/21 0001 07/08/21 0035 07/08/21 0101 07/08/21 0104  BP: 129/84  120/70   Pulse: (!) 114  (!) 106   Resp:      Temp:  99.3 F (37.4 C)  (!) 100.9 F (38.3 C)  TempSrc:  Axillary  Axillary  SpO2:      Weight:      Height:        FHT:  FHR: 145 bpm, variability: moderate,  accelerations:  Present,  decelerations:  Present early UC:   regular, every 1-2.5 minutes SVE:   Dilation: Lip/rim Effacement (%): 100 Station: -1, 0 Exam by:: [employer] SNM  A / P: 20 y.o. G2P1001 spontaneous labor cervix unchanged  -GBS negative -Continue Pitocin for labor augmentation  -Triple I- Amp and Gentamicin, Temp 100.9. Will monitor closely  -Fetal Wellbeing:  Category I -Pain Control:  Epidural -Anticipated MOD:  NSVD  Trellis Moment, SNM 07/08/2021, 1:34 AM

## 2021-07-08 NOTE — Anesthesia Postprocedure Evaluation (Signed)
Anesthesia Post Note  Patient: Lindsay Bryant  Procedure(s) Performed: AN AD HOC LABOR EPIDURAL     Patient location during evaluation: Mother Baby Anesthesia Type: Epidural Level of consciousness: awake and alert Pain management: pain level controlled Vital Signs Assessment: post-procedure vital signs reviewed and stable Respiratory status: spontaneous breathing, nonlabored ventilation and respiratory function stable Cardiovascular status: stable Postop Assessment: no headache, no backache and epidural receding Anesthetic complications: no   No notable events documented.  Last Vitals:  Vitals:   07/08/21 1459 07/08/21 1814  BP: (!) 106/56 105/79  Pulse: 84 70  Resp: 18 18  Temp: 36.8 C 36.7 C  SpO2: 99% 99%    Last Pain:  Vitals:   07/08/21 1814  TempSrc: Oral  PainSc: 0-No pain   Pain Goal: Patients Stated Pain Goal: 0 (07/07/21 2032)                 Jennye Moccasin, Weldon Picking

## 2021-07-08 NOTE — Discharge Summary (Signed)
Postpartum Discharge Summary  Date of Service updated 07/10/21     Patient Name: Lindsay Bryant DOB: May 28, 2001 MRN: [id number]  Date of admission: 07/07/2021 Delivery date:07/08/2021  Delivering provider: WHITE, Ubaldo Glassing D  Date of discharge: 07/10/2021  Admitting diagnosis: Indication for care in labor and delivery, antepartum [O75.9] Intrauterine pregnancy: [redacted]w[redacted]d    Secondary diagnosis:  Principal Problem:   Indication for care in labor and delivery, antepartum Active Problems:   Rh negative state in antepartum period  Additional problems: none    Discharge diagnosis: Term Pregnancy Delivered                                              Post partum procedures:rhogam and Nexplanon placement Augmentation: Pitocin Complications: Intrauterine Inflammation or infection (Chorioamniotis)  Hospital course: Onset of Labor With Vaginal Delivery      21y.o. yo G2P1001 at 331w6das admitted in Latent Labor on 07/07/2021. Patient had a labor course remarkable for requiring AROM of a forebag as well as Pitocin for augmentation. She developed a fever of 100.9 and received one dose of Amp/Gent prior to delivery.  Membrane Rupture Time/Date: 8:30 AM ,07/07/2021   Delivery Method:Vaginal, Spontaneous  Episiotomy: None  Lacerations:  None  Patient had an uncomplicated postpartum course.  She is ambulating, tolerating a regular diet, passing flatus, and urinating well. Patient is discharged home in stable condition on 07/10/21.  Newborn Data: Birth date:07/08/2021  Birth time:7:36 AM  Gender:Female  Living status:Living  Apgars:8 ,9  Weight:4550 g (10lb 0.5oz)  Magnesium Sulfate received: No BMZ received: No Rhophylac:Yes MMR:N/A T-DaP:Given prenatally Flu: Yes Transfusion:No  Physical exam  Vitals:   07/09/21 1446 07/09/21 1750 07/09/21 2347 07/10/21 0523  BP: (!) 110/47  109/64 115/83  Pulse: 90  72 (!) 55  Resp: 18  18 18   Temp: 98.8 F (37.1 C) 98.4 F (36.9 C) 98.1 F (36.7 C)  98.7 F (37.1 C)  TempSrc: Oral Oral Oral Oral  SpO2: 100%     Weight:      Height:       General: alert, cooperative, and no distress Lochia: appropriate Uterine Fundus: firm Incision: N/a DVT Evaluation: No evidence of DVT seen on physical exam. Labs: Lab Results  Component Value Date   WBC 14.5 (H) 07/07/2021   HGB 11.7 (L) 07/07/2021   HCT 36.7 07/07/2021   MCV 87.2 07/07/2021   PLT 202 07/07/2021   CMP Latest Ref Rng & Units 07/06/2019  Glucose 70 - 99 mg/dL 97  BUN 6 - 20 mg/dL 6  Creatinine 0.44 - 1.00 mg/dL 0.71  Sodium 135 - 145 mmol/L 140  Potassium 3.5 - 5.1 mmol/L 3.7  Chloride 98 - 111 mmol/L 104  CO2 22 - 32 mmol/L 25  Calcium 8.9 - 10.3 mg/dL 9.5  Total Protein 6.5 - 8.1 g/dL 6.9  Total Bilirubin 0.3 - 1.2 mg/dL 0.4  Alkaline Phos 38 - 126 U/L 78  AST 15 - 41 U/L 20  ALT 0 - 44 U/L 16   Edinburgh Score: Edinburgh Postnatal Depression Scale Screening Tool 07/08/2021  I have been able to laugh and see the funny side of things. 0  I have looked forward with enjoyment to things. 0  I have blamed myself unnecessarily when things went wrong. 2  I have been anxious or worried for no good reason.  2  I have felt scared or panicky for no good reason. 1  Things have been getting on top of me. 2  I have been so unhappy that I have had difficulty sleeping. 1  I have felt sad or miserable. 1  I have been so unhappy that I have been crying. 1  The thought of harming myself has occurred to me. 0  Edinburgh Postnatal Depression Scale Total 10     After visit meds:  Allergies as of 07/10/2021   No Known Allergies      Medication List     TAKE these medications    acetaminophen 325 MG tablet Commonly known as: Tylenol Take 2 tablets (650 mg total) by mouth every 4 (four) hours as needed (for pain scale < 4).   ibuprofen 600 MG tablet Commonly known as: ADVIL Take 1 tablet (600 mg total) by mouth every 6 (six) hours.   PRENATAL VITAMIN PO Take 1 tablet by  mouth daily.         Discharge home in stable condition Infant Feeding: Breast Infant Disposition:home with mother Discharge instruction: per After Visit Summary and Postpartum booklet. Activity: Advance as tolerated. Pelvic rest for 6 weeks.  Diet: routine diet Future Appointments: Future Appointments  Date Time Provider [place]  08/12/2021 11:10 AM Janyth Pupa, DO [facility]   Follow up Visit:  Follow-up Information     [facility] Follow up.   Specialty: Obstetrics and Gynecology Why: In 4-6 weeks for postpartum visit Contact information: [address] [place] [phone]                Myrtis Ser, CNM  Gloris [place] Please schedule this patient for Postpartum visit in: 6 weeks with the following provider: Any provider  In-Person  For C/S patients schedule nurse incision check in weeks 2 weeks: no  Low risk pregnancy complicated by: Rh neg  Delivery mode:  SVD  Anticipated Birth Control:  PP Nexplanon planning to have placed IP  PP Procedures needed: none  Schedule Integrated BH visit: no    07/10/2021 Fatima Blank, CNM

## 2021-07-08 NOTE — Progress Notes (Signed)
S: Patient feeling intermittent vaginal pressure but still able to rest. Patient has no questions at this time.    O: Vitals:   07/08/21 0131 07/08/21 0201 07/08/21 0231 07/08/21 0301  BP: 101/61 (!) 99/47 (!) 97/44 121/61  Pulse: (!) 104 (!) 105 (!) 107 (!) 103  Resp:    15  Temp:    100 F (37.8 C)  TempSrc:    Axillary  SpO2:      Weight:      Height:        FHT:  FHR: 135 bpm, variability: moderate,  accelerations:  Present,  decelerations:  Present early UC:   irregular, every 1-4 minutes SVE:   Dilation: Lip/rim Effacement (%): 100 Station: -1, 0 Exam by:: [employer] SNM  A / P: 20 y.o. G2P1001 [redacted]w[redacted]d Spontaneous labor, protracted active phase -cervix unchanged, discussed placing IUPC and patient agrees with the plan -Continue pitocin titration for adequate contractions  -Triple I- Amp and Gentamicin given, will continue to monitor.  -GBS negative Fetal Wellbeing:  Category I Pain Control:  Epidural Anticipated MOD:  NSVD  Trellis Moment, SNM 07/08/2021, 3:01 AM

## 2021-07-08 NOTE — Lactation Note (Signed)
This note was copied from a baby's chart. Lactation Consultation Note  Patient Name: Lindsay Bryant [id number] Date: 07/08/2021 Reason for consult: L&D Initial assessment;Term;Breastfeeding assistance;Other (Comment) (LD LC visit 62mins PP, Baby wide awake STS on moms chest. LC offered to assist to latch/ baby rooting . on and off at 1st and then fed with swallows 5-7 mins/ per mom comfortable. colostrum flow before and after Latch. ExpBF x1) Age:21 mins  Mom aware she will be seen by [facility] later today.  Maternal Data Has patient been taught Hand Expression?: Yes Does the patient have breastfeeding experience prior to this delivery?: Yes How long did the patient breastfeed?: per mom 6 months and then her supply didn't keep up with baby needs  Feeding Mother's Current Feeding Choice: Breast Milk  LATCH Score Latch: Grasps breast easily, tongue down, lips flanged, rhythmical sucking.  Audible Swallowing: A few with stimulation  Type of Nipple: Everted at rest and after stimulation  Comfort (Breast/Nipple): Soft / non-tender  Hold (Positioning): Assistance needed to correctly position infant at breast and maintain latch.  LATCH Score: 8   Lactation Tools Discussed/Used    Interventions Interventions: Breast feeding basics reviewed;Assisted with latch;Skin to skin;Breast compression;Hand express;Adjust position;Support pillows;Education  Discharge    Consult Status Consult Status: Follow-up from L&D Date: 07/08/21 Follow-up type: In-patient    [place] 07/08/2021, 8:38 AM

## 2021-07-08 NOTE — Lactation Note (Addendum)
This note was copied from a baby's chart. Lactation Consultation Note  Patient Name: Girl Molly Savarino DXIPJ'A Date: 07/08/2021 Reason for consult: Initial assessment;Mother's request;Difficult latch;Breastfeeding assistance Age:21 hours  LC assisted with latching infant in cross cradle prone with signs of milk transfer.  Compression stripe noted, no other signs of nipple trauma. Infant need assistance flanging out lips to relieve discomfort.  Plan 1. To feed based on cues 8-12x 24 hr period, latching infant at breast with compression to offer more volume.  2. Mom to hand express and give 5-7 ml per feeding.  3 I and O sheet reviewed.  All questions answered at the end of the visit.    Maternal Data Has patient been taught Hand Expression?: Yes Does the patient have breastfeeding experience prior to this delivery?: Yes How long did the patient breastfeed?: 2-3 months not able to get infant to latch, low milk supply  Feeding Mother's Current Feeding Choice: Breast Milk  LATCH Score Latch: Repeated attempts needed to sustain latch, nipple held in mouth throughout feeding, stimulation needed to elicit sucking reflex.  Audible Swallowing: Spontaneous and intermittent  Type of Nipple: Everted at rest and after stimulation (soft and compressible)  Comfort (Breast/Nipple): Filling, red/small blisters or bruises, mild/mod discomfort (compression stripe noted, pain start of latch with flanging out of lip resolved.)  Hold (Positioning): Assistance needed to correctly position infant at breast and maintain latch.  LATCH Score: 7   Lactation Tools Discussed/Used Tools: Pump;Flanges Flange Size: 27 Breast pump type: Manual Pump Education: Setup, frequency, and cleaning;Milk Storage Reason for Pumping: elongate nipple Pumping frequency: pre pump 5-10 min before latching  Interventions Interventions: Breast feeding basics reviewed;Support pillows;Education;Assisted with latch;Position  options;Skin to skin;Expressed milk;Breast massage;Coconut oil;LC Services brochure;Infant Driven Feeding Algorithm education;Hand express;Pre-pump if needed;Hand pump;Breast compression;Adjust position  Discharge Pump: Personal  Consult Status Consult Status: Follow-up Date: 07/09/21 Follow-up type: In-patient    Deneene Tarver  Nicholson-Springer 07/08/2021, 4:57 PM

## 2021-07-09 LAB — RH IG WORKUP (INCLUDES ABO/RH)
Fetal Screen: NEGATIVE
Gestational Age(Wks): 39.6
Unit division: 0

## 2021-07-09 NOTE — Progress Notes (Signed)
POSTPARTUM PROGRESS NOTE  Post Partum Day 1  Subjective:  Lindsay Bryant is a 21 y.o. [id number] s/p SVD at [redacted]w[redacted]d.  No acute events overnight.  Pt denies problems with ambulating, voiding or po intake.  She denies nausea or vomiting.  She is having cramping and occasional sharp pains but pain is overall well controlled.  She has had flatus. She has not had bowel movement.  Lochia Small.   Objective: Blood pressure 108/67, pulse 69, temperature 98.2 F (36.8 C), temperature source Oral, resp. rate 18, height 5\' 7"  (1.702 m), weight 77.4 kg, last menstrual period 10/02/2020, SpO2 100 %, unknown if currently breastfeeding.  Physical Exam:  General: alert, cooperative and no distress Chest: no respiratory distress Heart:regular rate, distal pulses intact Abdomen: soft, nontender Uterine Fundus: firm, appropriately tender DVT Evaluation: No calf swelling or tenderness Skin: warm, dry  Recent Labs    07/07/21 0755  HGB 11.7*  HCT 36.7    Assessment/Plan: Lindsay Bryant is a 21 y.o. 26 s/p SVD at [redacted]w[redacted]d   PPD#1 - Doing well. Received Rhogam yesterday. Contraception: Nexplanon Feeding: Breast Dispo: Plan for discharge 2/4.   LOS: 2 days   [redacted]w[redacted]d, Medical Student 07/09/2021, 7:20 AM

## 2021-07-09 NOTE — Plan of Care (Signed)
Problem: Education: ?Goal: Knowledge of condition will improve ?Outcome: Completed/Met ?  ?Problem: Activity: ?Goal: Will verbalize the importance of balancing activity with adequate rest periods ?Outcome: Completed/Met ?  ?Problem: Role Relationship: ?Goal: Ability to demonstrate positive interaction with newborn will improve ?Outcome: Completed/Met ?  ?

## 2021-07-09 NOTE — Social Work (Signed)
CSW received consult for hx of Anxiety, Depression and [place] 10.  CSW met with [name] to offer support and complete assessment.   ° °CSW met with [name] at bedside and introduced CSW role. CSW observed [name] sitting up in the bed, FOB sitting in recliner next to the bed and the infant asleep in the bassinet. CSW offered [name] privacy. [name] presented with a sad affect and welcomed CSW visit with FOB present.  CSW inquired how [name] has felt since giving birth. [name] reported that she feels stressed as it is related to the breastfeeding process. [name] reported that she had similar concerns with her older child, she shared that lactation has been in the room several times to provide education and support. CSW provided actively listening and validated [name] emotions and concerns. [name] expressed throughout the pregnancy she felt emotionally "up and down” and for the past seven days she has been very stressed with preparing for the infant, to the point where she feels things been getting on top of her. [name] reported she has considered taking medication for anxiety and depression but prefers to see how she feels once she returns home before talking with her OB provider. CSW encouraged [name] to talk with her provider or nurse at the hospital if she felt like she needed medication prior to discharge. [name] reported understanding. [name] reported that she saw a therapist at [facility] in 2018-2019 and did not feel the experience was helpful. [name] reported she is open to trying therapy again. She reported that her OB provider gave her list of therapy resources. 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 CSW inquired about [name] coping strategies. [name] reported that she implements deep breathing, tries to think about thing and finds things to do to keep her mind occupied. CSW encouraged [name] to continue implementing her coping strategies. CSW inquired about [name]  supports. [name] identified her spouse and mom as supports. CSW assessed [name] for safety. [name] denied thoughts of harm to self and others.  ° °CSW provided education regarding the baby blues period vs. perinatal mood disorders, discussed treatment and gave additional resources for mental health follow up if concerns arise.  CSW recommended [name] complete a self-evaluation during the postpartum time period using the New Mom Checklist from Postpartum Progress and encouraged [name] to contact a medical professional if symptoms are noted at any time. [name] reported she feels comfortable reaching out to her doctor if she has concerns.  ° °[name] reported she has essential items for the infant including a bassinet where the infant will sleep.  CSW provided review of Sudden Infant Death Syndrome (SIDS) precautions.   ° °CSW identifies no further need for intervention and no barriers to discharge at this time.  ° °Lindsay Bryant, MSW, LCSW °[facility]  °Clinical Social Worker  °[phone] °07/09/2021  11:39 AM  °

## 2021-07-09 NOTE — Progress Notes (Signed)
Post Partum Day 1 Subjective: no complaints, up ad lib, voiding and tolerating PO, small lochia, plans to breastfeed,  wants inpt Nexplanon  Objective: Blood pressure 108/67, pulse 69, temperature 98.2 F (36.8 C), temperature source Oral, resp. rate 18, height 5\' 7"  (1.702 m), weight 77.4 kg, last menstrual period 10/02/2020, SpO2 100 %, unknown if currently breastfeeding.  Physical Exam:  General: alert, cooperative and no distress Lochia:normal flow Chest: CTAB Heart: RRR no m/r/g Abdomen: +BS, soft, nontender,  Uterine Fundus: firm DVT Evaluation: No evidence of DVT seen on physical exam. Extremities: no edema  Recent Labs    07/07/21 0755  HGB 11.7*  HCT 36.7    Assessment/Plan: Plan for discharge tomorrow, Breastfeeding, and Lactation consult   LOS: 2 days   09/04/21 07/09/2021, 7:50 AM

## 2021-07-09 NOTE — Lactation Note (Addendum)
This note was copied from a baby's chart. Lactation Consultation Note  Patient Name: Lindsay Bryant TJQZE'S Date: 07/09/2021 Reason for consult: Follow-up assessment;Mother's request;Difficult latch Age:21 hours, LGA female infant. Mom requested latch assistance due breast soreness and painful latches. Per mom, she feels frustrated with breastfeeding due pain with latch, infant not sustain latch keeps coming off breast. Mom was given hand pump earlier by staff. Mom pre-pumped  with hand pump prior to latching infant at breast and when mom  pre-pumped to extend nipple shaft out, she  easily expressed 3 mls of colostrum that was spoon fed to infant. Infant was cuing to fed, mom latch infant on her right breast using the football hold position, infant at first was not sustaining latch and would slip to tip of mom's nipple. LC discussed importance keeping infant close to breast with nose touching and nostril free,  flanging out infant's top lip, mom re-latched and infant started sustaining latch , swallows  were heard during the feeding, and  infant breastfeed for 10 minutes. Afterwards mom self expressed 3 mls and gave to infant, infant appeared content. Mom will continue to breastfeed infant according feeding cues, 8 to 12+ or more times within 24 hours, skin to skin. Mom will continue to work on latch with infant, mom knows if she feels pinching to break latch and re-latch infant at the breast. RN will give mom coconut oil to help with breast soreness and mom knows she can apply EBM and let air dry on breast to help with healing. Mom knows she can ask RN/LC for further latch assistance if needed. Maternal Data    Feeding Mother's Current Feeding Choice: Breast Milk  LATCH Score Latch: Repeated attempts needed to sustain latch, nipple held in mouth throughout feeding, stimulation needed to elicit sucking reflex.  Audible Swallowing: A few with stimulation  Type of Nipple: Everted at rest  and after stimulation  Comfort (Breast/Nipple): Filling, red/small blisters or bruises, mild/mod discomfort (redness observed on nipple)  Hold (Positioning): Assistance needed to correctly position infant at breast and maintain latch.  LATCH Score: 6   Lactation Tools Discussed/Used Tools: Pump Flange Size: 24 Breast pump type: Manual Reason for Pumping: Pre-pump prior to latching infant at breast to help evert nippl shaft out more.  Interventions Interventions: Skin to skin;Assisted with latch;Pre-pump if needed;Breast compression;Adjust position;Support pillows;Position options;Expressed milk;Coconut oil;Hand pump  Discharge    Consult Status Consult Status: Follow-up Date: 07/10/21 Follow-up type: In-patient    Danelle Earthly 07/09/2021, 1:21 AM

## 2021-07-09 NOTE — Lactation Note (Signed)
This note was copied from a baby's chart. Lactation Consultation Note  Patient Name: Lindsay Bryant MVEHM'C Date: 07/09/2021 Reason for consult: Follow-up assessment;Term Age:21 hours   P2 mother whose infant is now 36 hours old.  This is a term baby at 39+6 weeks weighing > 10 lbs.  RN in room; offered to assist with latching and mother receptive.  She had recently finished a 20 minute feeding, however, was interested in latching to the alternate breast.  Assisted to latch "Lindsay Bryant" easily in the football hold.  Baby with a wide gape and flanged lips; mother denied pain with feeding.  Observed her feeding for an additional 10 minutes.  NT in room to complete "Lindsay Bryant's" bath.  Mother will call as needed for latch assistance.  She had no questions/concerns at this time.  Father present.   Maternal Data    Feeding Mother's Current Feeding Choice: Breast Milk  LATCH Score Latch: Grasps breast easily, tongue down, lips flanged, rhythmical sucking.  Audible Swallowing: A few with stimulation  Type of Nipple: Everted at rest and after stimulation  Comfort (Breast/Nipple): Soft / non-tender  Hold (Positioning): Assistance needed to correctly position infant at breast and maintain latch.  LATCH Score: 8   Lactation Tools Discussed/Used    Interventions Interventions: Breast feeding basics reviewed;Assisted with latch;Skin to skin;Hand express;Breast compression;Position options;Support pillows;Adjust position;Education  Discharge Pump: Personal  Consult Status Consult Status: Follow-up Date: 07/10/21 Follow-up type: In-patient    Haskell Rihn R Elisabet Gutzmer 07/09/2021, 5:57 AM

## 2021-07-10 DIAGNOSIS — Z30017 Encounter for initial prescription of implantable subdermal contraceptive: Secondary | ICD-10-CM

## 2021-07-10 MED ORDER — RHO D IMMUNE GLOBULIN 1500 UNIT/2ML IJ SOSY
300.0000 ug | PREFILLED_SYRINGE | Freq: Once | INTRAMUSCULAR | Status: DC
  Filled 2021-07-10: qty 2

## 2021-07-10 MED ORDER — ACETAMINOPHEN 325 MG PO TABS: 650.0000 mg | ORAL_TABLET | ORAL | 0 refills | Status: DC | PRN

## 2021-07-10 MED ORDER — IBUPROFEN 600 MG PO TABS: 600.0000 mg | ORAL_TABLET | Freq: Four times a day (QID) | ORAL | 0 refills | Status: DC

## 2021-07-10 NOTE — Plan of Care (Signed)

## 2021-07-10 NOTE — Procedures (Signed)
Post-Placental Nexplanon Insertion Procedure Note  Patient was identified. Informed consent was signed, signed copy in chart. A time-out was performed.    The insertion site was identified 8-10 cm (3-4 inches) from the medial epicondyle of the humerus and 3-5 cm (1.25-2 inches) posterior to (below) the sulcus (groove) between the biceps and triceps muscles of the patient's right arm and marked. The site was prepped and draped in the usual sterile fashion. Patient was prepped with alcohol swab and then injected with 3 cc of 1% lidocaine.  The site was then prepped with betadine. Nexplanon removed form packaging, device confirmed in needle, then inserted full length of needle and withdrawn per handbook instructions. Provider and patient verified presence of the implant in patient's arm by palpation. Insertion site was covered with steristrips/adhesive bandage and pressure bandage. There was minimal blood loss. Patient tolerated procedure well.  Patient was given post procedure instructions and Nexplanon user card with expiration date. Condoms were recommended for STI prevention. Patient was asked to keep the pressure dressing on for 24 hours to minimize bruising and keep the adhesive bandage on for 3-5 days. The patient verbalized understanding of the plan of care and agrees.   Lot #: [id number] Expiration Date: [id number]  Lindsay Meckel, MD

## 2021-07-10 NOTE — Lactation Note (Signed)
This note was copied from a baby's chart. Lactation Consultation Note  Patient Name: Girl Latonia Conrow STMHD'Q Date: 07/10/2021  Infant last feeding increased to 40 ml. Mom decided to pump only and offer EBM first followed by formula. LC reviewed feeding volumes and told to increase since she is pumping only. Volume guidelines provided.   Mom nipples compression stripes and healed bruising, no open abrasions noted. Mom given coconut oil for nipple care. Mom also give comfort gels to use rinse in between use and discard after 6 days. Mom aware to not use with coconut oil.   Infant adequate urine, stool should increase with increase in volume.  All questions answered at the end of the visit.    Age:32 hours  Maternal Data    Feeding Nipple Type: Nfant Slow Flow (purple)  LATCH Score                    Lactation Tools Discussed/Used    Interventions    Discharge    Consult Status      Foy Vanduyne  Nicholson-Springer 07/10/2021, 11:55 AM

## 2021-07-12 ENCOUNTER — Telehealth: Payer: Self-pay

## 2021-07-12 ENCOUNTER — Telehealth (HOSPITAL_COMMUNITY): Payer: Self-pay | Admitting: *Deleted

## 2021-07-12 DIAGNOSIS — Z1331 Encounter for screening for depression: Secondary | ICD-10-CM

## 2021-07-12 NOTE — Telephone Encounter (Signed)
SW Inpatient referral for [facility] for patient with EPDS=10. Dr. Crissie Reese notified via chart.  Duffy Rhody, RN 07-12-2021 at 1:45pm

## 2021-07-12 NOTE — Telephone Encounter (Signed)
Transition Care Management Follow-up Telephone Call Date of discharge and from where: 07/10/2021 from [facility] How have you been since you were released from the hospital? Patient stated that she is feeling well and did not have any questions or concerns at this time.  Any questions or concerns? No  Items Reviewed: Did the pt receive and understand the discharge instructions provided? Yes  Medications obtained and verified? Yes  Other? No  Any new allergies since your discharge? No  Dietary orders reviewed? No Do you have support at home? Yes   Functional Questionnaire: (I = Independent and D = Dependent) ADLs: I  Bathing/Dressing- I  Meal Prep- I  Eating- I  Maintaining continence- I  Transferring/Ambulation- I  Managing Meds- I   Follow up appointments reviewed:  PCP Hospital f/u appt confirmed? No   Specialist Hospital f/u appt confirmed? Yes  Scheduled to see Seward Speck, DO on 08/12/2021 @ 11:10am. Are transportation arrangements needed? No  If their condition worsens, is the pt aware to call PCP or go to the Emergency Dept.? Yes Was the patient provided with contact information for the PCP's office or ED? Yes Was to pt encouraged to call back with questions or concerns? Yes

## 2021-07-14 ENCOUNTER — Other Ambulatory Visit: Payer: Medicaid Other | Admitting: Obstetrics & Gynecology

## 2021-07-14 NOTE — BH Specialist Note (Signed)
Integrated Behavioral Health via Telemedicine Visit  07/26/2021 Lenee Franze [id number]  Number of Integrated Behavioral Health Clinician visits: 1- Initial Visit   Session Start time: 1351    Session End time: 1442  Total time in minutes: 51   Referring Provider: Merian Capron, MD Patient/Family location: Home [facility]  All persons participating in visit: Patient Lindsay Bryant and [facility] Arnecia Ector   Types of Service: Individual psychotherapy and Video visit  I connected with Martie Lee Sear and/or Kristianna Soley's  n/a  via  Telephone or Video Enabled Telemedicine Application  (Video is Caregility application) and verified that I am speaking with the correct person using two identifiers. Discussed confidentiality: Yes   I discussed the limitations of telemedicine and the availability of in person appointments.  Discussed there is a possibility of technology failure and discussed alternative modes of communication if that failure occurs.  I discussed that engaging in this telemedicine visit, they consent to the provision of behavioral healthcare and the services will be billed under their insurance.  Patient and/or legal guardian expressed understanding and consented to Telemedicine visit: Yes   Presenting Concerns: Patient and/or family reports the following symptoms/concerns: Primary concern irritability; also fatigue, fidgety, worry, restlessness, going 8-10 hours without eating;lack of quality sleep. Pt took BH medication in the past, did not notice any difference and stopped taking.  Duration of problem: Perinatal increase; Severity of problem:  moderately severe  Patient and/or Family's Strengths/Protective Factors: Social connections, Concrete supports in place (healthy food, safe environments, etc.), and Sense of purpose  Goals Addressed: Patient will:  Reduce symptoms of: anxiety and  depression   Increase knowledge and/or ability of: coping skills   Demonstrate ability to: Increase healthy adjustment to current life circumstances and Increase motivation to adhere to plan of care  Progress towards Goals: Ongoing  Interventions: Interventions utilized:  Mindfulness or Management consultant, Psychoeducation and/or Health Education, and Link to [employer] Standardized Assessments completed: GAD-7 and PHQ 9  Patient and/or Family Response: Pt agrees with treatment plan  Assessment: Patient currently experiencing Mood disorder, unspecified.   Patient may benefit from psychoeducation and brief therapeutic interventions regarding coping with symptoms of anxiety, depression .  Plan: Follow up with behavioral health clinician on : Two weeks Behavioral recommendations:  -May continue taking prenatal vitamin daily until postpartum medical visit -Consider placing easy-to-reach (for you, not 21yo) "one-handed" healthy snack (ex.fruit, nuts, granola bars) during the day, to eat when hunger is felt.  -Continue prioritizing healthy sleep, as much as able until husband returns to work in March -Consider registering for new mom support group at either [email or web address] or www.[email or web address].net Referral(s): Integrated Art gallery manager (In Clinic) and [employer]:  new mom support  I discussed the assessment and treatment plan with the patient and/or parent/guardian. They were provided an opportunity to ask questions and all were answered. They agreed with the plan and demonstrated an understanding of the instructions.   They were advised to call back or seek an in-person evaluation if the symptoms worsen or if the condition fails to improve as anticipated.  Rae Lips, LCSW  Depression screen [facility] 2/9 07/26/2021 04/14/2021 12/28/2020 04/03/2019  Decreased Interest 0 3 3 0  Down, Depressed, Hopeless 0 2 3 0  PHQ - 2 Score 0 5 6 0  Altered sleeping 0  3 2 3   Tired, decreased energy 3 3 3 3   Change in appetite 1 0 2  0  Feeling bad or failure about yourself  0 2 3 0  Trouble concentrating 0 1 0 0  Moving slowly or fidgety/restless 3 0 0 0  Suicidal thoughts 0 1 1 0  PHQ-9 Score 7 15 17 6    GAD 7 : Generalized Anxiety Score 07/26/2021 04/14/2021 12/28/2020  Nervous, Anxious, on Edge 1 2 2   Control/stop worrying 1 2 2   Worry too much - different things 3 3 3   Trouble relaxing 1 1 2   Restless 3 0 0  Easily annoyed or irritable 3 2 3   Afraid - awful might happen 1 1 1   Total GAD 7 Score 13 11 13

## 2021-07-20 ENCOUNTER — Telehealth (HOSPITAL_COMMUNITY): Payer: Self-pay | Admitting: *Deleted

## 2021-07-20 NOTE — Telephone Encounter (Signed)
Opened phone encounter accidentally.  Duffy Rhody, RN 07-20-2021 at 2:45pm

## 2021-07-26 ENCOUNTER — Ambulatory Visit (INDEPENDENT_AMBULATORY_CARE_PROVIDER_SITE_OTHER): Payer: Medicaid Other | Admitting: Clinical

## 2021-07-26 DIAGNOSIS — F39 Unspecified mood [affective] disorder: Secondary | ICD-10-CM | POA: Diagnosis not present

## 2021-07-26 NOTE — Patient Instructions (Signed)
[facility] [place], [place] [address] [phone] (main office) [phone] [facility] office)  New parent support groups: [email or web address]

## 2021-07-29 NOTE — BH Specialist Note (Signed)
Integrated Behavioral Health via Telemedicine Visit ? ?08/09/2021 ?Amonie Wisser ?[id number] ? ?Number of Integrated Behavioral Health Clinician visits: 2- Second Visit ? ?Session Start time: 1347 ?  ?Session End time: 1434 ? ?Total time in minutes: 47 ? ? ?Referring Provider: Merian Capron, MD ?Patient/Family location: Home ?[facility] Provider location: [facility] [employer] [facility] ? ?All persons participating in visit: Patient Lindsay Bryant and Lindsay Bryant  ? ?Types of Service: Individual psychotherapy and Video visit ? ?I connected with Lindsay Bryant and/or Lindsay Bryant's  n/a  via  Telephone or Video Enabled Telemedicine Application  (Video is Caregility application) and verified that I am speaking with the correct person using two identifiers. Discussed confidentiality: Yes  ? ?I discussed the limitations of telemedicine and the availability of in person appointments.  Discussed there is a possibility of technology failure and discussed alternative modes of communication if that failure occurs. ? ?I discussed that engaging in this telemedicine visit, they consent to the provision of behavioral healthcare and the services will be billed under their insurance. ? ?Patient and/or legal guardian expressed understanding and consented to Telemedicine visit: Yes  ? ?Presenting Concerns: ?Patient and/or family reports the following symptoms/concerns: Poor appetite, feel nausea when eating (ate 1/2 slice pizza two days ago only; 4 bites of chicken/rice yesterday) and dizziness that is increasing; "head feels really strange, like I can't determine what's real or not". ?Duration of problem: Increase postpartum; Severity of problem: severe ? ?Patient and/or Family's Strengths/Protective Factors: ?Social connections and Sense of purpose ? ?Goals Addressed: ?Patient will: ? Reduce symptoms of: anxiety, depression, and stress and food restriction ? Increase knowledge and/or ability of:  healthy habits  ? Demonstrate ability to: Increase motivation to adhere to plan of care ? ?Progress towards Goals: ?Ongoing ? ?Interventions: ?Interventions utilized:  Motivational Interviewing ?Standardized Assessments completed: GAD-7 and PHQ 9 ? ?Patient and/or Family Response: Pt agrees with treatment plan ? ? ?Assessment: ?Patient currently experiencing Eating disorder, unspecified type and Mood disorder.  ? ?Patient may benefit from continued therapeutic interventions. ? ?Plan: ?Follow up with behavioral health clinician on : Two weeks ?Behavioral recommendations:  ?-Continue eating at least once daily and sitting down when dizzy ?-Discuss with medical provider on 08/12/21 lack of appetite and treatment options ? ?Referral(s): Integrated [employer] (In Clinic) ? ?I discussed the assessment and treatment plan with the patient and/or parent/guardian. They were provided an opportunity to ask questions and all were answered. They agreed with the plan and demonstrated an understanding of the instructions. ?  ?They were advised to call back or seek an in-person evaluation if the symptoms worsen or if the condition fails to improve as anticipated. ? ?Rae Lips, LCSW ? ?Depression screen [facility] 2/9 08/09/2021 07/26/2021 04/14/2021 12/28/2020 04/03/2019  ?Decreased Interest 2 0 3 3 0  ?Down, Depressed, Hopeless 0 0 2 3 0  ?PHQ - 2 Score 2 0 5 6 0  ?Altered sleeping 0 0 3 2 3   ?Tired, decreased energy 3 3 3 3 3   ?Change in appetite 3 1 0 2 0  ?Feeling bad or failure about yourself  2 0 2 3 0  ?Trouble concentrating 0 0 1 0 0  ?Moving slowly or fidgety/restless 0 3 0 0 0  ?Suicidal thoughts - 0 1 1 0  ?PHQ-9 Score 10 7 15 17 6   ? ?GAD 7 : Generalized Anxiety Score 08/09/2021 07/26/2021 04/14/2021 12/28/2020  ?Nervous, Anxious, on Edge 3 1 2  2  ?Control/stop worrying 3 1 2 2   ?Worry too much - different things 3 3 3 3   ?Trouble relaxing 2 1 1 2   ?Restless 2 3 0 0  ?Easily annoyed or irritable 3 3 2 3   ?Afraid -  awful might happen 2 1 1 1   ?Total GAD 7 Score 18 13 11 13   ? ? ? ?

## 2021-08-09 ENCOUNTER — Ambulatory Visit (INDEPENDENT_AMBULATORY_CARE_PROVIDER_SITE_OTHER): Payer: Medicaid Other | Admitting: Clinical

## 2021-08-09 DIAGNOSIS — F509 Eating disorder, unspecified: Secondary | ICD-10-CM

## 2021-08-09 DIAGNOSIS — F39 Unspecified mood [affective] disorder: Secondary | ICD-10-CM | POA: Diagnosis not present

## 2021-08-12 ENCOUNTER — Other Ambulatory Visit: Payer: Self-pay

## 2021-08-12 ENCOUNTER — Ambulatory Visit (INDEPENDENT_AMBULATORY_CARE_PROVIDER_SITE_OTHER): Payer: Medicaid Other | Admitting: Obstetrics & Gynecology

## 2021-08-12 ENCOUNTER — Encounter: Payer: Self-pay | Admitting: Obstetrics & Gynecology

## 2021-08-12 ENCOUNTER — Other Ambulatory Visit (HOSPITAL_COMMUNITY)
Admission: RE | Admit: 2021-08-12 | Discharge: 2021-08-12 | Disposition: A | Discharge status: Home / Self Care | Payer: Medicaid Other | Source: Ambulatory Visit | Attending: Obstetrics & Gynecology | Admitting: Obstetrics & Gynecology

## 2021-08-12 DIAGNOSIS — Z124 Encounter for screening for malignant neoplasm of cervix: Secondary | ICD-10-CM | POA: Diagnosis not present

## 2021-08-12 DIAGNOSIS — F321 Major depressive disorder, single episode, moderate: Secondary | ICD-10-CM

## 2021-08-12 MED ORDER — SERTRALINE HCL 50 MG PO TABS: 50.0000 mg | ORAL_TABLET | Freq: Every day | ORAL | 4 refills | Status: AC

## 2021-08-12 NOTE — Addendum Note (Signed)
Addended by: Janece Canterbury on: 08/12/2021 11:58 AM ? ? Modules accepted: Orders ? ?

## 2021-08-12 NOTE — Progress Notes (Signed)
? ?POSTPARTUM VISIT ?Patient name: Lindsay Bryant MRN [id number]  Date of birth: [birth year] ?Chief Complaint:   ?Postpartum Care ? ?History of Present Illness:   ?Lindsay Bryant is a 21 y.o. G64P2002 female being seen today for a postpartum visit. She is 5wks postpartum following a spontaneous vaginal delivery at [redacted]w[redacted]d gestational weeks. IOL: No presented in latent labor ? ?Pregnancy uncomplicated. ? ?Last pap smear: 12/2020- LSIL, HPV +, []  repeat pap today  ? ? ?Postpartum course has been uncomplicated.  ?Bleeding no bleeding. Bowel function is normal. Bladder function is normal. Urinary incontinence? No, fecal incontinence? No ?Patient is sexually active since delivery, reports no issues.   ?Desired contraception: Nexplanon. Patient does not want a pregnancy in the future.   ?Desired family size is 1 children.  ? Upstream - 08/12/21 1115   ? ?  ? Pregnancy Intention Screening  ? Does the patient want to become pregnant in the next year? No   ? Does the patient's partner want to become pregnant in the next year? No   ? Would the patient like to discuss contraceptive options today? No   ?  ? Contraception Wrap Up  ? Current Method Hormonal Implant   ? End Method Hormonal Implant   ? Contraception Counseling Provided No   ? ?  ?  ? ?  ? ?The pregnancy intention screening data noted above was reviewed. Potential methods of contraception were discussed. The patient elected to proceed with Hormonal Implant. ? ?Edinburgh Postpartum Depression Screening: Positive   ? Edinburgh Postnatal Depression Scale - 08/12/21 1116   ? ?  ? Edinburgh Postnatal Depression Scale:  In the Past 7 Days  ? I have been able to laugh and see the funny side of things. 0   ? I have looked forward with enjoyment to things. 1   ? I have blamed myself unnecessarily when things went wrong. 2   ? I have been anxious or worried for no good reason. 2   ? I have felt scared or panicky for no good reason. 3   ? Things have been getting on top of me. 3   ? I  have been so unhappy that I have had difficulty sleeping. 1   ? I have felt sad or miserable. 2   ? I have been so unhappy that I have been crying. 1   ? The thought of harming myself has occurred to me. 1   ? Edinburgh Postnatal Depression Scale Total 16   ? ?  ?  ? ?  ? ? ?Baby's course has been uncomplicated. Baby is feeding by bottle. Infant has a pediatrician/family doctor? Yes.  Pt is not working and plans to stay at home with baby. ?Review of Systems:   ?Pertinent items are noted in HPI ?Denies Abnormal vaginal discharge w/ itching/odor/irritation, headaches, visual changes, shortness of breath, chest pain, abdominal pain, severe nausea/vomiting, or problems with urination or bowel movements. ?Pertinent History Reviewed:  ?Reviewed past medical,surgical, obstetrical and family history.  ?Reviewed problem list, medications and allergies. ?OB History  ?Gravida Para Term Preterm AB Living  ?2 2 2     2   ?SAB IAB Ectopic Multiple Live Births  ?      0 2  ?  ?# Outcome Date GA Lbr Len/2nd Weight Sex Delivery Anes PTL Lv  ?2 Term 07/08/21 [redacted]w[redacted]d 22:22 / 00:44 10 lb 0.5 oz (4.55 kg) F Vag-Spont EPI  LIV  ?1 Term 06/14/19 [redacted]w[redacted]d 08:22 / 04:16 9  lb 10.7 oz (4.386 kg) M Vag-Spont EPI N LIV  ? ?Physical Assessment:  ? ?Vitals:  ? 08/12/21 1114  ?BP: 106/71  ?Pulse: 79  ?Weight: 140 lb 9.6 oz (63.8 kg)  ?Height: 5\' 7"  (1.702 m)  ?Body mass index is 22.02 kg/m?. ? ?     Physical Examination:  ? General appearance: alert, well appearing, and in no distress ? Mental status: flat affect, mood appropriate ? Skin: warm & dry  ? Cardiovascular: normal heart rate noted  ? Respiratory: normal respiratory effort, no distress  ? Breasts: deferred, no complaints  ? Abdomen: soft, non-tender  ? Pelvic: normal external genitalia, vulva, vagina, cervix, uterus and adnexa ? Extremities: no edema ? ?Chaperone:   ?      ?No results found for this or any previous visit (from the past 24 hour(s)).  ?Assessment & Plan:  ?1)  Postpartum exam ?2) 5 wks s/p spontaneous vaginal delivery ?3) bottle feeding ?4) Depression screening- elevated. Pt has been seen by behavioral health services- reviewed resources and encouraged pt to continue with counseling/therapy on a regular basis.   ?-Rx for zoloft 50mg  daily, may increase to 100mg  at next visit ? ?5) Contraception management: Nexplanon in place ? ?Essential components of care per ACOG recommendations: ? ?- Sexuality, contraception and birth spacing ?Provided guidance regarding sexuality, management of dyspareunia, and resumption of intercourse ?Discussed avoiding interpregnancy interval <25mths and recommended birth spacing of 18 months ? ?- Sleep and fatigue ?Discussed coping options for fatigue and sleep disruption ?Encouraged family/partner/community support of 4 hrs of uninterrupted sleep to help with mood and fatigue ? ?-Physical recovery  ?Patient is safe to resume physical activity. Discussed attainment of healthy weight. ? ? ?-. Health maintenance ?Pap collected due to prior abnormal pap ? ?Meds:  ?Meds ordered this encounter  ?Medications  ? sertraline (ZOLOFT) 50 MG tablet  ?  Sig: Take 1 tablet (50 mg total) by mouth daily.  ?  Dispense:  90 tablet  ?  Refill:  4  ? ? ?Follow-up: Return in about 6 weeks (around 09/23/2021) for Medication follow up.  ? ? , DO ?Attending Obstetrician & Gynecologist, Faculty Practice ?[facility] for [facility], [facility] ? ? ?  ?

## 2021-08-16 LAB — CYTOLOGY - PAP: Diagnosis: NEGATIVE

## 2021-09-23 ENCOUNTER — Ambulatory Visit: Payer: Medicaid Other | Admitting: Obstetrics & Gynecology

## 2021-09-23 ENCOUNTER — Encounter: Payer: Self-pay | Admitting: Obstetrics & Gynecology

## 2021-09-23 VITALS — BP 121/76 | HR 76 | Ht 67.0 in | Wt 142.6 lb

## 2021-09-23 DIAGNOSIS — F321 Major depressive disorder, single episode, moderate: Secondary | ICD-10-CM

## 2021-09-23 NOTE — Progress Notes (Signed)
? ?  GYN VISIT ?Patient name: Lindsay Bryant MRN [id number]  Date of birth: [birth year] ?Chief Complaint:   ?Follow-up (On Zoloft; it's working pretty well) ? ?History of Present Illness:   ?Lindsay Bryant is a 21 y.o. G4P2002 female being seen today for follow up regarding: ? ?-postpartum anxiety: Starting on zoloft 50mg  daily.  She does note an improvement in her anxiety and feels like the medication is helping.  She feels like this is the right dose.  Denies SI/HI.  Reports no acute complaints ? ?Of note, moving to next month ? ?Patient's last menstrual period was 09/17/2021. ? ? ?  09/23/2021  ? 11:03 AM 08/12/2021  ? 11:16 AM 07/08/2021  ?  6:14 PM 07/17/2019  ?  2:14 PM  ?09/14/2019 Postnatal Depression Scale Screening Tool  ?I have been able to laugh and see the funny side of things. 0 0 0 0  ?I have looked forward with enjoyment to things. 1 1 0 0  ?I have blamed myself unnecessarily when things went wrong. 1 2 2  0  ?I have been anxious or worried for no good reason. 3 2 2 1   ?I have felt scared or panicky for no good reason. 3 3 1  0  ?Things have been getting on top of me. 2 3 2  0  ?I have been so unhappy that I have had difficulty sleeping. 0 1 1 0  ?I have felt sad or miserable. 0 2 1 0  ?I have been so unhappy that I have been crying. 0 1 1 0  ?The thought of harming myself has occurred to me. 0 1 0 0  ?Edinburgh Postnatal Depression Scale Total 10 16 10 1   ?  ? ?Review of Systems:   ?Pertinent items are noted in HPI ?Denies fever/chills, dizziness, headaches, visual disturbances, fatigue, shortness of breath, chest pain, abdominal pain, vomiting, no problems with periods, bowel movements, urination, or intercourse unless otherwise stated above.  ?Pertinent History Reviewed:  ?Reviewed past medical,surgical, social, obstetrical and family history.  ?Reviewed problem list, medications and allergies. ?Physical Assessment:  ? ?Vitals:  ? 09/23/21 1059  ?BP: 121/76  ?Pulse: 76  ?Weight: 142 lb 9.6 oz (64.7 kg)   ?Height: 5\' 7"  (1.702 m)  ?Body mass index is 22.33 kg/m?. ? ?     Physical Examination:  ? General appearance: alert, well appearing, and in no distress ? Psych: mood appropriate, normal affect ? Skin: warm & dry  ? Cardiovascular: normal heart rate noted ? Respiratory: normal respiratory effort, no distress ? Extremities: no edema  ? ?Chaperone: N/A   ? ?Assessment & Plan:  ?1) Postpartum anxiety ?-improved ?-plan to continue with current medication ?-encouraged pt to continue to for a least first year postpartum ? ? ?Return in about 1 year (around 09/24/2022) for March 2024 annual. ? ? ? , DO ?Attending Obstetrician & Gynecologist, Faculty Practice ?Center for , [facility] ? ? ? ?
